# Patient Record
Sex: Male | Born: 1970 | Race: White | Hispanic: No | Smoking: Never smoker
Health system: Southern US, Community
[De-identification: ages and names within clinical notes are randomized; demographics above are authoritative.]

## PROBLEM LIST (undated history)

## (undated) VITALS — BP 157/113 | HR 99 | Temp 97.4°F | Resp 16 | Ht 67.0 in | Wt 185.0 lb

## (undated) DIAGNOSIS — R569 Unspecified convulsions: Secondary | ICD-10-CM

## (undated) DIAGNOSIS — F1020 Alcohol dependence, uncomplicated: Secondary | ICD-10-CM

## (undated) DIAGNOSIS — F32A Depression, unspecified: Secondary | ICD-10-CM

## (undated) DIAGNOSIS — I729 Aneurysm of unspecified site: Secondary | ICD-10-CM

## (undated) DIAGNOSIS — F329 Major depressive disorder, single episode, unspecified: Secondary | ICD-10-CM

## (undated) HISTORY — PX: OTHER SURGICAL HISTORY: SHX169

## (undated) HISTORY — PX: SKIN GRAFT: SHX250

---

## 2003-07-13 ENCOUNTER — Inpatient Hospital Stay (HOSPITAL_COMMUNITY): Admission: EM | Admit: 2003-07-13 | Discharge: 2003-07-16 | Payer: Self-pay | Admitting: Psychiatry

## 2003-12-20 ENCOUNTER — Other Ambulatory Visit: Payer: Self-pay

## 2003-12-21 ENCOUNTER — Other Ambulatory Visit: Payer: Self-pay

## 2004-02-21 ENCOUNTER — Other Ambulatory Visit: Payer: Self-pay

## 2004-03-08 ENCOUNTER — Other Ambulatory Visit: Payer: Self-pay

## 2004-03-22 ENCOUNTER — Other Ambulatory Visit: Payer: Self-pay

## 2004-03-26 ENCOUNTER — Other Ambulatory Visit: Payer: Self-pay

## 2004-04-20 ENCOUNTER — Other Ambulatory Visit: Payer: Self-pay

## 2004-07-22 ENCOUNTER — Inpatient Hospital Stay: Payer: Self-pay | Admitting: Internal Medicine

## 2004-07-22 ENCOUNTER — Other Ambulatory Visit: Payer: Self-pay

## 2004-07-23 ENCOUNTER — Other Ambulatory Visit: Payer: Self-pay

## 2004-07-26 ENCOUNTER — Inpatient Hospital Stay: Payer: Self-pay | Admitting: Anesthesiology

## 2004-07-26 ENCOUNTER — Other Ambulatory Visit: Payer: Self-pay

## 2004-08-20 ENCOUNTER — Emergency Department: Payer: Self-pay | Admitting: Emergency Medicine

## 2004-09-17 ENCOUNTER — Emergency Department: Payer: Self-pay | Admitting: Emergency Medicine

## 2004-09-18 ENCOUNTER — Emergency Department: Payer: Self-pay | Admitting: Emergency Medicine

## 2004-10-22 ENCOUNTER — Inpatient Hospital Stay: Payer: Self-pay | Admitting: Internal Medicine

## 2004-10-22 ENCOUNTER — Other Ambulatory Visit: Payer: Self-pay

## 2004-10-26 ENCOUNTER — Emergency Department: Payer: Self-pay | Admitting: Unknown Physician Specialty

## 2004-10-27 ENCOUNTER — Emergency Department: Payer: Self-pay | Admitting: Internal Medicine

## 2004-10-30 ENCOUNTER — Ambulatory Visit: Payer: Self-pay | Admitting: Internal Medicine

## 2005-05-10 ENCOUNTER — Emergency Department: Payer: Self-pay | Admitting: General Practice

## 2005-06-23 ENCOUNTER — Emergency Department: Payer: Self-pay | Admitting: Emergency Medicine

## 2005-06-23 ENCOUNTER — Other Ambulatory Visit: Payer: Self-pay

## 2005-06-24 ENCOUNTER — Other Ambulatory Visit: Payer: Self-pay

## 2005-06-25 ENCOUNTER — Inpatient Hospital Stay: Payer: Self-pay | Admitting: Internal Medicine

## 2006-03-15 ENCOUNTER — Other Ambulatory Visit: Payer: Self-pay

## 2006-03-15 ENCOUNTER — Emergency Department: Payer: Self-pay | Admitting: Emergency Medicine

## 2006-03-18 ENCOUNTER — Other Ambulatory Visit: Payer: Self-pay

## 2006-03-18 ENCOUNTER — Inpatient Hospital Stay: Payer: Self-pay | Admitting: Internal Medicine

## 2006-03-24 ENCOUNTER — Other Ambulatory Visit: Payer: Self-pay

## 2012-07-12 ENCOUNTER — Encounter (HOSPITAL_COMMUNITY): Payer: Self-pay | Admitting: Emergency Medicine

## 2012-07-12 ENCOUNTER — Emergency Department (HOSPITAL_COMMUNITY)
Admission: EM | Admit: 2012-07-12 | Discharge: 2012-07-13 | Disposition: A | Discharge status: Other Acute Inpatient Hospital | Payer: Self-pay | Attending: Emergency Medicine | Admitting: Emergency Medicine

## 2012-07-12 DIAGNOSIS — R110 Nausea: Secondary | ICD-10-CM | POA: Insufficient documentation

## 2012-07-12 DIAGNOSIS — R259 Unspecified abnormal involuntary movements: Secondary | ICD-10-CM | POA: Insufficient documentation

## 2012-07-12 DIAGNOSIS — Z8679 Personal history of other diseases of the circulatory system: Secondary | ICD-10-CM | POA: Insufficient documentation

## 2012-07-12 DIAGNOSIS — R6889 Other general symptoms and signs: Secondary | ICD-10-CM | POA: Insufficient documentation

## 2012-07-12 DIAGNOSIS — F411 Generalized anxiety disorder: Secondary | ICD-10-CM | POA: Insufficient documentation

## 2012-07-12 DIAGNOSIS — F1010 Alcohol abuse, uncomplicated: Secondary | ICD-10-CM | POA: Insufficient documentation

## 2012-07-12 DIAGNOSIS — G40909 Epilepsy, unspecified, not intractable, without status epilepticus: Secondary | ICD-10-CM | POA: Insufficient documentation

## 2012-07-12 HISTORY — DX: Unspecified convulsions: R56.9

## 2012-07-12 HISTORY — DX: Aneurysm of unspecified site: I72.9

## 2012-07-12 LAB — CBC WITH DIFFERENTIAL/PLATELET
HCT: 39.4 % (ref 39.0–52.0)
Hemoglobin: 13.3 g/dL (ref 13.0–17.0)
Lymphocytes Relative: 30 % (ref 12–46)
Monocytes Absolute: 0.6 10*3/uL (ref 0.1–1.0)
Monocytes Relative: 12 % (ref 3–12)
Neutro Abs: 2.9 10*3/uL (ref 1.7–7.7)
WBC: 5.2 10*3/uL (ref 4.0–10.5)

## 2012-07-12 LAB — COMPREHENSIVE METABOLIC PANEL
ALT: 89 U/L — ABNORMAL HIGH (ref 0–53)
BUN: 8 mg/dL (ref 6–23)
Calcium: 8.7 mg/dL (ref 8.4–10.5)
GFR calc Af Amer: >90 mL/min (ref >90)
Glucose, Bld: 91 mg/dL (ref 70–99)
Sodium: 134 mEq/L — ABNORMAL LOW (ref 135–145)
Total Protein: 8.1 g/dL (ref 6.0–8.3)

## 2012-07-12 LAB — RAPID URINE DRUG SCREEN, HOSP PERFORMED
Amphetamines: NOT DETECTED
Benzodiazepines: NOT DETECTED
Tetrahydrocannabinol: NOT DETECTED

## 2012-07-12 MED ORDER — ALUM & MAG HYDROXIDE-SIMETH 200-200-20 MG/5ML PO SUSP: 30.0000 mL | ORAL | Status: DC | PRN

## 2012-07-12 MED ORDER — LORAZEPAM 1 MG PO TABS
0.0000 mg | ORAL_TABLET | Freq: Two times a day (BID) | ORAL | Status: DC
  Administered 2012-07-13: 2 mg via ORAL

## 2012-07-12 MED ORDER — LORAZEPAM 1 MG PO TABS: 1.0000 mg | ORAL_TABLET | Freq: Four times a day (QID) | ORAL | Status: DC | PRN

## 2012-07-12 MED ORDER — VITAMIN B-1 100 MG PO TABS
100.0000 mg | ORAL_TABLET | Freq: Every day | ORAL | Status: DC
  Filled 2012-07-12 (×2): qty 1

## 2012-07-12 MED ORDER — IBUPROFEN 200 MG PO TABS: 600.0000 mg | ORAL_TABLET | Freq: Three times a day (TID) | ORAL | Status: DC | PRN

## 2012-07-12 MED ORDER — ADULT MULTIVITAMIN W/MINERALS CH
1.0000 | ORAL_TABLET | Freq: Every day | ORAL | Status: DC
  Administered 2012-07-12: 1 via ORAL
  Filled 2012-07-12 (×2): qty 1

## 2012-07-12 MED ORDER — THIAMINE HCL 100 MG/ML IJ SOLN: 100.0000 mg | Freq: Every day | INTRAMUSCULAR | Status: DC

## 2012-07-12 MED ORDER — LORAZEPAM 1 MG PO TABS
0.0000 mg | ORAL_TABLET | Freq: Four times a day (QID) | ORAL | Status: DC
  Administered 2012-07-12 – 2012-07-13 (×2): 2 mg via ORAL
  Filled 2012-07-12 (×3): qty 2

## 2012-07-12 MED ORDER — FOLIC ACID 1 MG PO TABS
1.0000 mg | ORAL_TABLET | Freq: Every day | ORAL | Status: DC
  Administered 2012-07-12: 1 mg via ORAL
  Filled 2012-07-12 (×2): qty 1

## 2012-07-12 MED ORDER — SODIUM CHLORIDE 0.9 % IV BOLUS (SEPSIS)
1000.0000 mL | Freq: Once | INTRAVENOUS | Status: AC
  Administered 2012-07-13: 1000 mL via INTRAVENOUS

## 2012-07-12 MED ORDER — LORAZEPAM 2 MG/ML IJ SOLN: 1.0000 mg | Freq: Four times a day (QID) | INTRAMUSCULAR | Status: DC | PRN

## 2012-07-12 MED ORDER — NICOTINE 21 MG/24HR TD PT24: 21.0000 mg | MEDICATED_PATCH | Freq: Every day | TRANSDERMAL | Status: DC

## 2012-07-12 MED ORDER — ONDANSETRON HCL 4 MG PO TABS: 4.0000 mg | ORAL_TABLET | Freq: Three times a day (TID) | ORAL | Status: DC | PRN

## 2012-07-12 NOTE — ED Notes (Signed)
Pt was wanded by security.  
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
## 2012-07-12 NOTE — ED Notes (Signed)
Maren Reamer:  [phone]

## 2012-07-12 NOTE — ED Notes (Signed)
Pt presenting to ed with c/o requesting detox. Pt states he had drank 12-18 beers everyday x last 5 days. Pt denies SI/HI pt is alert and oriented at this time. Pt with girlfriend at bedside

## 2012-07-12 NOTE — ED Provider Notes (Signed)
History     CSN: [id number]  Arrival date & time 07/12/12  Eric Manning   First MD Initiated Contact with Patient 07/12/12 2209      Chief Complaint  Patient presents with  . detox    (Consider location/radiation/quality/duration/timing/severity/associated sxs/prior treatment) HPI History provided by pt.   Pt presents w/ request for alcohol detox.  On average, drinks 18 beers a day w/ an occasional liquor drink.  Usually has 2 beers in the middle of the night when he gets up to use restroom.  Most recent drink early this morning.  He is currently experiencing tremors, anxiety and mild nausea.  Reports prior h/o seizures in setting of alcohol withdrawal, as well as DTs, though no documentation to confirm. Denies using any other illicit drugs.  No other psych history and denies SI/HI.   Also c/o sensation of throat and tongue edema x 2 days.  Has not started any new medications and has no known allergies.  Has sore throat as well.    Past Medical History  Diagnosis Date  . Aneurysm   . Seizures     Past Surgical History  Procedure Date  . Skin graft   . Rotator cuff surgery     No family history on file.  History  Substance Use Topics  . Smoking status: Never Smoker   . Smokeless tobacco: Not on file  . Alcohol Use: 0.0 oz/week    12-18 Cans of beer per week     Comment: daily      Review of Systems  All other systems reviewed and are negative.    Allergies  Review of patient's allergies indicates no known allergies.  Home Medications   Current Outpatient Rx  Name  Route  Sig  Dispense  Refill  . GOODY HEADACHE PO   Oral   Take 1 packet by mouth 3 (three) times daily as needed. For pain.         Marland Kitchen GUMMI BEAR MULTIVITAMIN/MIN PO CHEW   Oral   Chew 2 tablets by mouth daily.           BP 155/83  Pulse 118  Temp 98 F (36.7 C) (Oral)  Resp 18  SpO2 97%  Physical Exam  Nursing note and vitals reviewed. Constitutional: He is oriented to person, place, and  time. He appears well-developed and well-nourished. No distress.  HENT:  Head: Normocephalic and atraumatic.  Mouth/Throat: Oropharynx is clear and moist and mucous membranes are normal. No posterior oropharyngeal edema.       No obvious lip or tongue edema.  Right tonsillar exudate.  Erythema tonsils, posterior pharynx and soft palate.   Eyes:       Normal appearance  Neck: Normal range of motion.  Cardiovascular: Regular rhythm.        tachycardia  Pulmonary/Chest: Effort normal and breath sounds normal. No stridor. No respiratory distress.  Abdominal: Soft. Bowel sounds are normal. He exhibits no distension. There is no tenderness.  Musculoskeletal: Normal range of motion.  Neurological: He is alert and oriented to person, place, and time.       Pt is very tremulous  Skin: Skin is warm and dry. No rash noted.  Psychiatric: He has a normal mood and affect. His behavior is normal.    ED Course  Procedures (including critical care time)  Labs Reviewed  COMPREHENSIVE METABOLIC PANEL - Abnormal; Notable for the following:    Sodium 134 (*)     AST 121 (*)  ALT 89 (*)     All other components within normal limits  CBC WITH DIFFERENTIAL - Abnormal; Notable for the following:    Platelets 138 (*)     All other components within normal limits  ETHANOL - Abnormal; Notable for the following:    Alcohol, Ethyl (B) 275 (*)     All other components within normal limits  URINE RAPID DRUG SCREEN (HOSP PERFORMED)   No results found.   No diagnosis found.    MDM  41yo M w/ reported h/o seizures in setting of alcohol withdrawal, as well as DTs, presents w/ request for alcohol detox.  Also c/o tongue/throat edema and odynophagia x 2d.  On exam, pt tachycardic, tremulous and anxious appearing, no obvious tongue/lip edema or stridor, but erythema and exudate tonsils/posterior pharynx. CIWA protocol initiated.  Will check on patient frequently to determine whether or not admission for  detox is indicated.  Labs unremarkable w/ exception of strep screen which is pending. 11:24 PM   After 2mg  po ativan, Tremors have improved but patient tachy at 132bpm.  Will move to room on acute side, place on monitor and give him an IV NS bolus.  12:16 AM   Patient has responded to fluids and ativan.  HR improved and he is less tremulous.  Dr. Patria Mane has moved him back to TCU and spoken w/ ACT.      Otilio Miu, [place] 07/13/12 [phone]

## 2012-07-12 NOTE — ED Notes (Signed)
Pt changed into blue scrubs/red socks, pt/family explained psyc visitor/phone call policy, belongings taken home w/ wife, pt to be wanded by security.

## 2012-07-13 ENCOUNTER — Emergency Department (HOSPITAL_COMMUNITY): Payer: Self-pay

## 2012-07-13 ENCOUNTER — Inpatient Hospital Stay (HOSPITAL_COMMUNITY)
Admission: EM | Admit: 2012-07-13 | Discharge: 2012-07-16 | DRG: 897 | Disposition: A | Discharge status: Home / Self Care | Payer: Federal, State, Local not specified - Other | Source: Ambulatory Visit | Attending: Psychiatry | Admitting: Psychiatry

## 2012-07-13 ENCOUNTER — Encounter (HOSPITAL_COMMUNITY): Payer: Self-pay | Admitting: *Deleted

## 2012-07-13 DIAGNOSIS — F4310 Post-traumatic stress disorder, unspecified: Secondary | ICD-10-CM | POA: Diagnosis present

## 2012-07-13 DIAGNOSIS — F3289 Other specified depressive episodes: Secondary | ICD-10-CM | POA: Diagnosis present

## 2012-07-13 DIAGNOSIS — F329 Major depressive disorder, single episode, unspecified: Secondary | ICD-10-CM | POA: Diagnosis present

## 2012-07-13 DIAGNOSIS — F1020 Alcohol dependence, uncomplicated: Principal | ICD-10-CM | POA: Diagnosis present

## 2012-07-13 DIAGNOSIS — R030 Elevated blood-pressure reading, without diagnosis of hypertension: Secondary | ICD-10-CM | POA: Diagnosis present

## 2012-07-13 MED ORDER — ADULT MULTIVITAMIN W/MINERALS CH
1.0000 | ORAL_TABLET | Freq: Every day | ORAL | Status: DC
  Administered 2012-07-14 – 2012-07-16 (×3): 1 via ORAL
  Filled 2012-07-13 (×6): qty 1

## 2012-07-13 MED ORDER — FOLIC ACID 1 MG PO TABS
1.0000 mg | ORAL_TABLET | Freq: Every day | ORAL | Status: DC
  Administered 2012-07-14 – 2012-07-16 (×3): 1 mg via ORAL
  Filled 2012-07-13 (×6): qty 1

## 2012-07-13 MED ORDER — THIAMINE HCL 100 MG/ML IJ SOLN: 100.0000 mg | Freq: Every day | INTRAMUSCULAR | Status: DC

## 2012-07-13 MED ORDER — LORAZEPAM 2 MG/ML IJ SOLN: 2.0000 mg | Freq: Once | INTRAMUSCULAR | Status: DC

## 2012-07-13 MED ORDER — VITAMIN B-1 100 MG PO TABS
100.0000 mg | ORAL_TABLET | Freq: Every day | ORAL | Status: DC
  Administered 2012-07-14 – 2012-07-16 (×3): 100 mg via ORAL
  Filled 2012-07-13 (×6): qty 1

## 2012-07-13 MED ORDER — THIAMINE HCL 100 MG/ML IJ SOLN: 100.0000 mg | Freq: Once | INTRAMUSCULAR | Status: DC

## 2012-07-13 MED ORDER — CHLORDIAZEPOXIDE HCL 25 MG PO CAPS: 25.0000 mg | ORAL_CAPSULE | Freq: Every day | ORAL | Status: DC

## 2012-07-13 MED ORDER — SODIUM CHLORIDE 0.9 % IV BOLUS (SEPSIS)
1000.0000 mL | Freq: Once | INTRAVENOUS | Status: AC
  Administered 2012-07-13: 1000 mL via INTRAVENOUS

## 2012-07-13 MED ORDER — VITAMIN B-1 100 MG PO TABS
100.0000 mg | ORAL_TABLET | Freq: Every day | ORAL | Status: DC
  Filled 2012-07-13: qty 1

## 2012-07-13 MED ORDER — CHLORDIAZEPOXIDE HCL 25 MG PO CAPS
25.0000 mg | ORAL_CAPSULE | Freq: Four times a day (QID) | ORAL | Status: AC | PRN
  Administered 2012-07-14: 25 mg via ORAL
  Filled 2012-07-13 (×2): qty 1

## 2012-07-13 MED ORDER — LOPERAMIDE HCL 2 MG PO CAPS: 2.0000 mg | ORAL_CAPSULE | ORAL | Status: AC | PRN

## 2012-07-13 MED ORDER — LORAZEPAM 2 MG/ML IJ SOLN: 1.0000 mg | Freq: Four times a day (QID) | INTRAMUSCULAR | Status: DC | PRN

## 2012-07-13 MED ORDER — ONDANSETRON 4 MG PO TBDP: 4.0000 mg | ORAL_TABLET | Freq: Four times a day (QID) | ORAL | Status: AC | PRN

## 2012-07-13 MED ORDER — CHLORDIAZEPOXIDE HCL 25 MG PO CAPS
25.0000 mg | ORAL_CAPSULE | ORAL | Status: DC
  Administered 2012-07-16: 25 mg via ORAL
  Filled 2012-07-13: qty 1

## 2012-07-13 MED ORDER — CHLORDIAZEPOXIDE HCL 25 MG PO CAPS
25.0000 mg | ORAL_CAPSULE | Freq: Three times a day (TID) | ORAL | Status: AC
  Administered 2012-07-15 (×3): 25 mg via ORAL
  Filled 2012-07-13 (×3): qty 1

## 2012-07-13 MED ORDER — HYDROXYZINE HCL 25 MG PO TABS
25.0000 mg | ORAL_TABLET | Freq: Four times a day (QID) | ORAL | Status: AC | PRN
  Administered 2012-07-15: 25 mg via ORAL

## 2012-07-13 MED ORDER — LORAZEPAM 1 MG PO TABS
1.0000 mg | ORAL_TABLET | Freq: Four times a day (QID) | ORAL | Status: DC | PRN
  Administered 2012-07-13: 1 mg via ORAL
  Filled 2012-07-13: qty 1

## 2012-07-13 MED ORDER — CHLORDIAZEPOXIDE HCL 25 MG PO CAPS
25.0000 mg | ORAL_CAPSULE | Freq: Four times a day (QID) | ORAL | Status: AC
  Administered 2012-07-13 – 2012-07-14 (×7): 25 mg via ORAL
  Filled 2012-07-13 (×6): qty 1

## 2012-07-13 MED ORDER — ADULT MULTIVITAMIN W/MINERALS CH
1.0000 | ORAL_TABLET | Freq: Every day | ORAL | Status: DC
  Filled 2012-07-13 (×2): qty 1
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
## 2012-07-13 NOTE — BHH Counselor (Signed)
Patient accepted to [facility] by Donell Sievert, PA to Dr. Geoffery Lyons. The patient's room assignment is 301-2. Notified EDP (Dr. Baldemar Friday) and he agrees to discharge patient accordingly. Also notified patients nurse Marchelle Folks) of patient's disposition. She will call report to [phone] and assist with patients transfer to [facility].

## 2012-07-13 NOTE — ED Provider Notes (Signed)
Medical screening examination/treatment/procedure(s) were conducted as a shared visit with non-physician practitioner(s) and myself.  I personally evaluated the patient during the encounter  After IV fluids and several doses of ativan his HR improved. He was likely in early withdrawal, however, at this time I believe the pt is stable for transfer to psych ER with ongoing monitoring for ETOH withdrawal. Ativan protocol. Spoke with [facility] who will evaluate for placement  Lyanne Co, MD 07/13/12 2205

## 2012-07-13 NOTE — Tx Team (Signed)
Initial Interdisciplinary Treatment Plan  PATIENT STRENGTHS: (choose at least two) Ability for insight Average or above average intelligence Communication skills General fund of knowledge Motivation for treatment/growth Physical Health Special hobby/interest Supportive family/friends Work skills  PATIENT STRESSORS: Substance abuse   PROBLEM LIST: Problem List/Patient Goals Date to be addressed Date deferred Reason deferred Estimated date of resolution  Substance abuse 07/13/12                                                      DISCHARGE CRITERIA:  Ability to meet basic life and health needs Improved stabilization in mood, thinking, and/or behavior Motivation to continue treatment in a less acute level of care Need for constant or close observation no longer present Withdrawal symptoms are absent or subacute and managed without 24-hour nursing intervention  PRELIMINARY DISCHARGE PLAN: Attend aftercare/continuing care group Attend 12-step recovery group Return to previous living arrangement Return to previous work or school arrangements  PATIENT/FAMIILY INVOLVEMENT: This treatment plan has been presented to and reviewed with the patient, Eric Manning, and/or family member,   The patient and family have been given the opportunity to ask questions and make suggestions.  [name] 07/13/2012, 11:48 AM
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
## 2012-07-13 NOTE — ED Notes (Signed)
[id number]> Expected date:<BR> Expected time:<BR> Means of arrival:<BR> Comments:<BR> Hold for TCU pt

## 2012-07-13 NOTE — ED Notes (Signed)
Patient transferred to [facility]. Escorted by Social worker. Patient transferred without any belongings.

## 2012-07-14 DIAGNOSIS — F1010 Alcohol abuse, uncomplicated: Secondary | ICD-10-CM

## 2012-07-14 MED ORDER — ONDANSETRON 4 MG PO TBDP: 4.0000 mg | ORAL_TABLET | Freq: Four times a day (QID) | ORAL | Status: DC | PRN

## 2012-07-14 MED ORDER — NAPROXEN 500 MG PO TABS
500.0000 mg | ORAL_TABLET | Freq: Two times a day (BID) | ORAL | Status: DC | PRN
  Administered 2012-07-15 (×2): 500 mg via ORAL
  Filled 2012-07-14 (×2): qty 1

## 2012-07-14 MED ORDER — HYDROXYZINE HCL 25 MG PO TABS: 25.0000 mg | ORAL_TABLET | Freq: Four times a day (QID) | ORAL | Status: DC | PRN

## 2012-07-14 MED ORDER — CLONIDINE HCL 0.1 MG PO TABS
0.1000 mg | ORAL_TABLET | Freq: Four times a day (QID) | ORAL | Status: AC
  Administered 2012-07-14 – 2012-07-15 (×8): 0.1 mg via ORAL
  Filled 2012-07-14 (×7): qty 1

## 2012-07-14 MED ORDER — METHOCARBAMOL 500 MG PO TABS
500.0000 mg | ORAL_TABLET | Freq: Three times a day (TID) | ORAL | Status: DC | PRN
  Administered 2012-07-15 (×2): 500 mg via ORAL
  Filled 2012-07-14 (×2): qty 1

## 2012-07-14 MED ORDER — DICYCLOMINE HCL 20 MG PO TABS: 20.0000 mg | ORAL_TABLET | Freq: Four times a day (QID) | ORAL | Status: DC | PRN

## 2012-07-14 MED ORDER — LOPERAMIDE HCL 2 MG PO CAPS: 2.0000 mg | ORAL_CAPSULE | ORAL | Status: DC | PRN

## 2012-07-14 MED ORDER — CLONIDINE HCL 0.1 MG PO TABS
0.1000 mg | ORAL_TABLET | ORAL | Status: DC
  Administered 2012-07-16: 0.1 mg via ORAL
  Filled 2012-07-14 (×4): qty 1

## 2012-07-14 MED ORDER — CLONIDINE HCL 0.1 MG PO TABS
0.1000 mg | ORAL_TABLET | Freq: Every day | ORAL | Status: DC
  Filled 2012-07-14: qty 1

## 2012-07-14 NOTE — Progress Notes (Signed)
[facility] MD Progress Note  07/14/2012 12:08 PM Eric Manning  MRN:  [id number]  Diagnosis:   Axis I: Alcohol Abuse, Depressive Disorder NOS and Post Traumatic Stress Disorder Axis II: Deferred Axis III:  Past Medical History  Diagnosis Date  . Aneurysm   . Seizures     last seizure 2006 alcohol withdrawals   Axis IV: economic problems, other psychosocial or environmental problems and problems related to social environment Axis V: 41-50 serious symptoms  ADL's:  Intact  Sleep: Fair  Appetite:  Fair  Suicidal Ideation:  Denies Homicidal Ideation:  Denies  Mental Status Examination/Evaluation: Objective:  Appearance: Casual  Eye Contact::  Fair  Speech:  Normal Rate  Volume:  Normal  Mood:  Depressed  Affect:  Congruent  Thought Process:  Intact  Orientation:  Full  Thought Content:  WDL  Suicidal Thoughts:  No  Homicidal Thoughts:  No  Memory:  Immediate;   Fair Recent;   Fair Remote;   Fair  Judgement:  Fair  Insight:  Fair  Psychomotor Activity:  Normal  Concentration:  Fair  Recall:  Fair  Akathisia:  No  Handed:  Right  AIMS (if indicated):     Assets:  Communication Skills Desire for Improvement Intimacy Physical Health Social Support  Sleep:  Number of Hours: 4.25    Vital Signs:Blood pressure 143/105, pulse 98, temperature 98 F (36.7 C), temperature source Oral, resp. rate 20, height 5\' 7"  (1.702 m), weight 83.915 kg (185 lb). Current Medications: Current Facility-Administered Medications  Medication Dose Route Frequency Provider Last Rate Last Dose  . chlordiazePOXIDE (LIBRIUM) capsule 25 mg  25 mg Oral Q6H PRN Rachael Fee, MD   25 mg at 07/14/12 0402  . chlordiazePOXIDE (LIBRIUM) capsule 25 mg  25 mg Oral QID Rachael Fee, MD   25 mg at 07/14/12 7829   Followed by  . chlordiazePOXIDE (LIBRIUM) capsule 25 mg  25 mg Oral TID Rachael Fee, MD       Followed by  . chlordiazePOXIDE (LIBRIUM) capsule 25 mg  25 mg Oral BH-qamhs Rachael Fee, MD         Followed by  . chlordiazePOXIDE (LIBRIUM) capsule 25 mg  25 mg Oral Daily Rachael Fee, MD      . cloNIDine (CATAPRES) tablet 0.1 mg  0.1 mg Oral QID Nanine Means, NP   0.1 mg at 07/14/12 5621   Followed by  . cloNIDine (CATAPRES) tablet 0.1 mg  0.1 mg Oral BH-qamhs Nanine Means, NP       Followed by  . cloNIDine (CATAPRES) tablet 0.1 mg  0.1 mg Oral QAC breakfast Nanine Means, NP      . dicyclomine (BENTYL) tablet 20 mg  20 mg Oral Q6H PRN Nanine Means, NP      . folic acid (FOLVITE) tablet 1 mg  1 mg Oral Daily Nanine Means, NP   1 mg at 07/14/12 3086  . hydrOXYzine (ATARAX/VISTARIL) tablet 25 mg  25 mg Oral Q6H PRN Rachael Fee, MD      . loperamide (IMODIUM) capsule 2-4 mg  2-4 mg Oral PRN Rachael Fee, MD      . methocarbamol (ROBAXIN) tablet 500 mg  500 mg Oral Q8H PRN Nanine Means, NP      . multivitamin with minerals tablet 1 tablet  1 tablet Oral Daily Rachael Fee, MD   1 tablet at 07/14/12 5784  . naproxen (NAPROSYN) tablet 500 mg  500 mg Oral BID  PRN Nanine Means, NP      . ondansetron (ZOFRAN-ODT) disintegrating tablet 4 mg  4 mg Oral Q6H PRN Rachael Fee, MD      . thiamine (B-1) injection 100 mg  100 mg Intramuscular Once Rachael Fee, MD      . thiamine (VITAMIN B-1) tablet 100 mg  100 mg Oral Daily Nanine Means, NP   100 mg at 07/14/12 0821  . [DISCONTINUED] hydrOXYzine (ATARAX/VISTARIL) tablet 25 mg  25 mg Oral Q6H PRN Nanine Means, NP      . [DISCONTINUED] loperamide (IMODIUM) capsule 2-4 mg  2-4 mg Oral PRN Nanine Means, NP      . [DISCONTINUED] LORazepam (ATIVAN) injection 1 mg  1 mg Intravenous Q6H PRN Nanine Means, NP      . [DISCONTINUED] LORazepam (ATIVAN) tablet 1 mg  1 mg Oral Q6H PRN Nanine Means, NP   1 mg at 07/13/12 1218  . [DISCONTINUED] multivitamin with minerals tablet 1 tablet  1 tablet Oral Daily Nanine Means, NP      . [DISCONTINUED] ondansetron (ZOFRAN-ODT) disintegrating tablet 4 mg  4 mg Oral Q6H PRN Nanine Means, NP      . [DISCONTINUED]  thiamine (B-1) injection 100 mg  100 mg Intravenous Daily Nanine Means, NP      . [DISCONTINUED] thiamine (VITAMIN B-1) tablet 100 mg  100 mg Oral Daily Rachael Fee, MD        Lab Results:  Results for orders placed during the hospital encounter of 07/12/12 (from the past 48 hour(s))  URINE RAPID DRUG SCREEN (HOSP PERFORMED)     Status: Normal   Collection Time   07/12/12  7:25 PM      Component Value Range Comment   Opiates NONE DETECTED  NONE DETECTED    Cocaine NONE DETECTED  NONE DETECTED    Benzodiazepines NONE DETECTED  NONE DETECTED    Amphetamines NONE DETECTED  NONE DETECTED    Tetrahydrocannabinol NONE DETECTED  NONE DETECTED    Barbiturates NONE DETECTED  NONE DETECTED   COMPREHENSIVE METABOLIC PANEL     Status: Abnormal   Collection Time   07/12/12  7:40 PM      Component Value Range Comment   Sodium 134 (*) 135 - 145 mEq/L    Potassium 4.2  3.5 - 5.1 mEq/L    Chloride 97  96 - 112 mEq/L    CO2 22  19 - 32 mEq/L    Glucose, Bld 91  70 - 99 mg/dL    BUN 8  6 - 23 mg/dL    Creatinine, Ser 1.61  0.50 - 1.35 mg/dL    Calcium 8.7  8.4 - 09.6 mg/dL    Total Protein 8.1  6.0 - 8.3 g/dL    Albumin 4.3  3.5 - 5.2 g/dL    AST 045 (*) 0 - 37 U/L    ALT 89 (*) 0 - 53 U/L    Alkaline Phosphatase 68  39 - 117 U/L    Total Bilirubin 0.6  0.3 - 1.2 mg/dL    GFR calc non Af Amer >90  >90 mL/min    GFR calc Af Amer >90  >90 mL/min   CBC WITH DIFFERENTIAL     Status: Abnormal   Collection Time   07/12/12  7:40 PM      Component Value Range Comment   WBC 5.2  4.0 - 10.5 K/uL    RBC 4.52  4.22 - 5.81 MIL/uL  Hemoglobin 13.3  13.0 - 17.0 g/dL    HCT 16.1  09.6 - 04.5 %    MCV 87.2  78.0 - 100.0 fL    MCH 29.4  26.0 - 34.0 pg    MCHC 33.8  30.0 - 36.0 g/dL    RDW 40.9  81.1 - 91.4 %    Platelets 138 (*) 150 - 400 K/uL    Neutrophils Relative 57  43 - 77 %    Neutro Abs 2.9  1.7 - 7.7 K/uL    Lymphocytes Relative 30  12 - 46 %    Lymphs Abs 1.5  0.7 - 4.0 K/uL    Monocytes  Relative 12  3 - 12 %    Monocytes Absolute 0.6  0.1 - 1.0 K/uL    Eosinophils Relative 0  0 - 5 %    Eosinophils Absolute 0.0  0.0 - 0.7 K/uL    Basophils Relative 1  0 - 1 %    Basophils Absolute 0.1  0.0 - 0.1 K/uL   ETHANOL     Status: Abnormal   Collection Time   07/12/12  7:40 PM      Component Value Range Comment   Alcohol, Ethyl (B) 275 (*) 0 - 11 mg/dL   RAPID STREP SCREEN     Status: Normal   Collection Time   07/12/12 11:17 PM      Component Value Range Comment   Streptococcus, Group A Screen (Direct) NEGATIVE  NEGATIVE     Physical Findings: AIMS: Facial and Oral Movements Muscles of Facial Expression: None, normal Lips and Perioral Area: None, normal Jaw: None, normal Tongue: None, normal,Extremity Movements Upper (arms, wrists, hands, fingers): None, normal Lower (legs, knees, ankles, toes): None, normal, Trunk Movements Neck, shoulders, hips: None, normal, Overall Severity Severity of abnormal movements (highest score from questions above): None, normal Incapacitation due to abnormal movements: None, normal Patient's awareness of abnormal movements (rate only patient's report): No Awareness, Dental Status Current problems with teeth and/or dentures?: No Does patient usually wear dentures?: No  CIWA:  CIWA-Ar Total: 7  COWS:  COWS Total Score: 9   Treatment Plan Summary: Daily contact with patient to assess and evaluate symptoms and progress in treatment Medication management  Plan:  Individual and group therapy to continue, medication management of detox and blood pressure, blood pressure was elevated and clonidine ordered to address the issue  Nanine Means, NP 07/14/2012, 12:08 PM

## 2012-07-14 NOTE — Progress Notes (Signed)
Patient ID: Eric Manning, male   DOB: 1971/06/23, 41 y.o.   MRN: [id number] D- Patient reports depression and anxiety have decreased. Reports his generalized pain is 6 with a pain goal of 0. Denies SI/HI/AV hall .  A- Attending and participating in groups affect is blunted. B/P 144/92 clonidine 0.1 given as per ordered for detox symptoms.  R- Continue to monitor as per protocol, maintain 15 minute checks. Emotional support provided.

## 2012-07-14 NOTE — Progress Notes (Signed)
Pt laying in bed resting with eyes closed. Respirations even and unlabored. No distress noted.  

## 2012-07-14 NOTE — Tx Team (Signed)
Interdisciplinary Treatment Plan Update (Adult)  Date:  07/14/2012  Time Reviewed:  9:55 AM   Progress in Treatment: Attending groups: Yes Participating in groups:  Yes Taking medication as prescribed: Yes Tolerating medication:  Yes Family/Significant othe contact made:  CSW assessing for appropriate contact Patient understands diagnosis:  Yes Discussing patient identified problems/goals with staff:  Yes Medical problems stabilized or resolved:  Yes Denies suicidal/homicidal ideation: Yes Issues/concerns per patient self-inventory:  None identified Other: N/A  New problem(s) identified: None Identified  Reason for Continuation of Hospitalization: Anxiety Depression Medication stabilization Withdrawal symptoms  Interventions implemented related to continuation of hospitalization: mood stabilization, medication monitoring and adjustment, group therapy and psycho education, safety checks q 15 mins  Additional comments: N/A  Estimated length of stay: 3-5 days  Discharge Plan: CSW is assessing for appropriate referrals.    New goal(s): N/A  Review of initial/current patient goals per problem list:    1.  Goal(s): Address substance use  Met:  No  Target date: by discharge  As evidenced by: completing detox protocol and refer to appropriate treatment  2.  Goal (s): Reduce depressive and anxiety symptoms  Met:  No  Target date: by discharge  As evidenced by: Reducing depression from a 10 to a 3 as reported by pt.    Attendees: Patient:     Family:     Physician: Irving Lugo, MD 07/14/2012 9:55 AM   Nursing: Donna Shimp, RN 07/14/2012 9:55 AM   Clinical Social Worker:  Brodan Grewell Horton, LCSWA 07/14/2012  9:55 AM   Other: Donna Perez, RN 07/14/2012  9:55 AM   Other:  Jamison Lord, NP 07/14/2012 9:58 AM   Other:  Patrice White, RN 07/14/2012 9:59 AM   Other:  Jake King, Psyc intern 07/14/2012 9:59 AM   Other:      Scribe for Treatment Team:   Hyder Deman Horton 07/14/2012  9:55 AM   

## 2012-07-14 NOTE — Social Work (Signed)
Aftercare Planning Group: 07/14/2012 9:45 AM  Pt attended discharge planning group and actively participated in group.  CSW provided pt with today's workbook.  Pt presents with calm mood and affect.  Pt rates anxiety at a 5 and denies having depression, SI/HI.  Pt reports feeling better today.  Pt will return home and is open to following up at [facility] for medication management and therapy.  No further needs voiced by pt at this time.     [facility] Group Note : Clinical Social Worker Group Therapy  07/14/2012  1:15 PM  Type of Therapy:  Group Therapy  Participation Level:  Appropriate  Participation Quality:  Appropriate   Affect:  Appropriate  Cognitive:  Alert  Insight:  Good  Engagement in Group:  Good  Engagement in Therapy:  Good  Modes of Intervention:  Activity, Clarification, Education, Socialization and Support  Summary of Progress/Problems: The topic of group discussion today was emotion regulation and mindfulness.  CSW discussed being aware of thoughts and how they affect your mood, physical sensations and behaviors.  CSW discussed how pt can use this to stay in the moment and suspending judgment on self.  Pt actively participated in discussion and expressed how they could use this in their life.     Jonothan Heberle Horton, LCSWA 07/14/2012 3:00 pm

## 2012-07-15 NOTE — Progress Notes (Signed)
[facility] Family/Significant Other Suicide Prevention Education  Suicide Prevention Education:  Education Completed; [place] - girlfriend [phone]),  (name of family member/significant other) has been identified by the patient as the family member/significant other with whom the patient will be residing, and identified as the person(s) who will aid the patient in the event of a mental health crisis (suicidal ideations/suicide attempt).  With written consent from the patient, the family member/significant other has been provided the following suicide prevention education, prior to the and/or following the discharge of the patient.  The suicide prevention education provided includes the following:  Suicide risk factors  Suicide prevention and interventions  National Suicide Hotline telephone number  [facility] assessment telephone number  [facility] Emergency Assistance 911  [facility] and/or Residential Mobile Crisis Unit telephone number  Request made of family/significant other to:  Remove weapons (e.g., guns, rifles, knives), all items previously/currently identified as safety concern.    Remove drugs/medications (over-the-counter, prescriptions, illicit drugs), all items previously/currently identified as a safety concern.  The family member/significant other verbalizes understanding of the suicide prevention education information provided.  The family member/significant other agrees to remove the items of safety concern listed above.  Eric Manning 07/15/2012, 12:31 PM

## 2012-07-15 NOTE — Progress Notes (Signed)
D:  Per pt self inventory pt slept well, appetite is good, energy level is normal, ability to pay attention is good...., Pt denies SI/HI/AVH, denies withdrawal s/s, c/o back pain 5/10, denies depression and hopelessness.  A:  Encouraged pt to attend groups, pain meds given prn  R:  Pt receptive to treatment, pt pleasant and cooperative, interactive in milieu and attends, pt plans to go to outpatient treatment after discharge

## 2012-07-15 NOTE — Social Work (Signed)
Aftercare Planning Group: 07/15/2012 9:45 AM  Pt attended discharge planning group and actively participated in group.  CSW provided pt with today's workbook.  Pt presents with calm mood and affect.  Pt denies having anxiety and SI/HI.  Pt rates anxiety at a 3 today.  Pt reports feeling much better.  Pt is scheduled to follow up with [facility] for medication management and therapy.  No further needs voiced by pt at this time.  Safety planning and suicide prevention discussed.  Pt participated in discussion and acknowledged an understanding of the information provided.       [facility] Group Note : Clinical Social Worker Group Therapy  07/15/2012  1:15 PM  Type of Therapy:  Group Therapy  Participation Level:  Appropriate  Participation Quality:  Appropriate   Affect:  Appropriate  Cognitive:  Alert  Insight:  Good  Engagement in Group:  Good  Engagement in Therapy:  Good  Modes of Intervention:  Clarification, Education, Problem-solving, Socialization and Support  Summary of Progress/Problems: The topic for group was balance in life.  Pt participated in the discussion about when their life was in balance and out of balance and how this feels.  Pt discussed ways to get back in balance and short term goals they can work on to get where they want to be.  Pt discussed feeling like he has balance in his life at home is trying to figure out how to maintain this balance when he d/c.    Jerimie Mancuso Horton, LCSWA 07/15/2012 3:00 pm

## 2012-07-15 NOTE — Progress Notes (Signed)
D: Pt has been up and has been attending and participating in various milieu activities throughout the day today, pt does not display any signs or symptoms of withdrawal and has stated that he is feeling ok, pt has received all medications without incident and has spoken about being discharged tomorrow. A: Pt provided with support and encouragement.  R: Pt remains safe, will continue to monitor.

## 2012-07-15 NOTE — Progress Notes (Signed)
Patient attended group: Writer used therapy ball to ask questions about patients. Writer also discussed with group alternative positive ways to cope with negative behaviors. 

## 2012-07-15 NOTE — Progress Notes (Signed)
Patient ID: Eric Manning, male   DOB: 26-Nov-1970, 41 y.o.   MRN: [id number]  D:  Pt was brighter today than previous days. Informed the writer that the visitor was his girlfriend. Pt smiled as he spoke of his girlfriend. Informed the writer that his visit went well, "She's one of a kind". Smiling pt stated, "She's a [place]". Writer asked pt if that was a good thing. Stated, "oh yeah'. Pt stated he plans to return to their home.  A:  Support and encouragement was offered. 15 min checks continued for safety.  R: Pt remains safe.

## 2012-07-15 NOTE — Progress Notes (Signed)
[facility] MD Progress Note  07/15/2012 9:16 AM Eric Manning  MRN:  [id number]  Diagnosis:   Axis I: Alcohol Dependence, Depressive Disorder NOS, PTSD Axis II: Deferred Axis III:  Past Medical History  Diagnosis Date  . Aneurysm   . Seizures     last seizure 2006 alcohol withdrawals   Axis IV: None identified Axis V: 51-60 moderate symptoms  ADL's:  Intact  Sleep: Fair  Appetite:  Fair  Suicidal Ideation:  Plan:  Denies Intent:  Denies Means:  Denies Homicidal Ideation:  Plan:  Denies Intent:  Denies Means:  Denies  Has lacked a stable environment for a long time. This relationship has been the most stable situation he has been on. He is concerned about messing up. He is committed to his abstinence. He understands that he has a lot of things he needs to address from his past.  Depression: Not since he quit drinking 1 Anxiety:4 Cravings: 0 Mental Status Examination/Evaluation: Objective:  Appearance: Fairly Groomed  Patent attorney::  Fair  Speech:  Clear and Coherent  Volume:  Normal  Mood:  Better  Affect:  Appropriate  Thought Process:  Coherent, Goal Directed and Intact  Orientation:  Full  Thought Content:  WDL  Suicidal Thoughts:  No  Homicidal Thoughts:  No  Memory:  Immediate;   Fair Recent;   Fair Remote;   Fair  Judgement:  Intact  Insight:  Fair  Psychomotor Activity:  Normal  Concentration:  Fair  Recall:  Fair  Akathisia:  No  Handed:  Right  AIMS (if indicated):     Assets:  Communication Skills Desire for Improvement Housing Social Support  Sleep:  Number of Hours: 6    Vital Signs:Blood pressure 124/91, pulse 98, temperature 97.6 F (36.4 C), temperature source Oral, resp. rate 20, height 5\' 7"  (1.702 m), weight 83.915 kg (185 lb). Current Medications: Current Facility-Administered Medications  Medication Dose Route Frequency Provider Last Rate Last Dose  . chlordiazePOXIDE (LIBRIUM) capsule 25 mg  25 mg Oral Q6H PRN Rachael Fee, MD   25 mg  at 07/14/12 0402  . [COMPLETED] chlordiazePOXIDE (LIBRIUM) capsule 25 mg  25 mg Oral QID Rachael Fee, MD   25 mg at 07/14/12 2240   Followed by  . chlordiazePOXIDE (LIBRIUM) capsule 25 mg  25 mg Oral TID Rachael Fee, MD   25 mg at 07/15/12 0757   Followed by  . chlordiazePOXIDE (LIBRIUM) capsule 25 mg  25 mg Oral BH-qamhs Rachael Fee, MD       Followed by  . chlordiazePOXIDE (LIBRIUM) capsule 25 mg  25 mg Oral Daily Rachael Fee, MD      . cloNIDine (CATAPRES) tablet 0.1 mg  0.1 mg Oral QID Nanine Means, NP   0.1 mg at 07/15/12 0757   Followed by  . cloNIDine (CATAPRES) tablet 0.1 mg  0.1 mg Oral BH-qamhs Nanine Means, NP       Followed by  . cloNIDine (CATAPRES) tablet 0.1 mg  0.1 mg Oral QAC breakfast Nanine Means, NP      . dicyclomine (BENTYL) tablet 20 mg  20 mg Oral Q6H PRN Nanine Means, NP      . folic acid (FOLVITE) tablet 1 mg  1 mg Oral Daily Nanine Means, NP   1 mg at 07/15/12 0757  . hydrOXYzine (ATARAX/VISTARIL) tablet 25 mg  25 mg Oral Q6H PRN Rachael Fee, MD      . loperamide (IMODIUM) capsule 2-4 mg  2-4 mg Oral PRN Rachael Fee, MD      . methocarbamol (ROBAXIN) tablet 500 mg  500 mg Oral Q8H PRN Nanine Means, NP      . multivitamin with minerals tablet 1 tablet  1 tablet Oral Daily Rachael Fee, MD   1 tablet at 07/15/12 0757  . naproxen (NAPROSYN) tablet 500 mg  500 mg Oral BID PRN Nanine Means, NP      . ondansetron (ZOFRAN-ODT) disintegrating tablet 4 mg  4 mg Oral Q6H PRN Rachael Fee, MD      . thiamine (B-1) injection 100 mg  100 mg Intramuscular Once Rachael Fee, MD      . thiamine (VITAMIN B-1) tablet 100 mg  100 mg Oral Daily Nanine Means, NP   100 mg at 07/15/12 0757  . [DISCONTINUED] hydrOXYzine (ATARAX/VISTARIL) tablet 25 mg  25 mg Oral Q6H PRN Nanine Means, NP      . [DISCONTINUED] loperamide (IMODIUM) capsule 2-4 mg  2-4 mg Oral PRN Nanine Means, NP      . [DISCONTINUED] ondansetron (ZOFRAN-ODT) disintegrating tablet 4 mg  4 mg Oral Q6H PRN  Nanine Means, NP        Lab Results: No results found for this or any previous visit (from the past 48 hour(s)).  Physical Findings: AIMS: Facial and Oral Movements Muscles of Facial Expression: None, normal Lips and Perioral Area: None, normal Jaw: None, normal Tongue: None, normal,Extremity Movements Upper (arms, wrists, hands, fingers): None, normal Lower (legs, knees, ankles, toes): None, normal, Trunk Movements Neck, shoulders, hips: None, normal, Overall Severity Severity of abnormal movements (highest score from questions above): None, normal Incapacitation due to abnormal movements: None, normal Patient's awareness of abnormal movements (rate only patient's report): No Awareness, Dental Status Current problems with teeth and/or dentures?: No Does patient usually wear dentures?: No  CIWA:  CIWA-Ar Total: 0  COWS:  COWS Total Score: 1   Treatment Plan Summary: Daily contact with patient to assess and evaluate symptoms and progress in treatment Medication management  Plan: Supportive approach/coping skills/relapse prevention  Cathryn Gallery A 07/15/2012, 9:16 AM

## 2012-07-16 MED ORDER — GUMMI BEAR MULTIVITAMIN/MIN PO CHEW: 2.0000 | CHEWABLE_TABLET | Freq: Every day | ORAL | Status: DC

## 2012-07-16 NOTE — Discharge Summary (Signed)
Physician Discharge Summary Note  Patient:  Eric Manning is an 41 y.o., male MRN:  [id number] DOB:  [birth year] Patient phone:  [phone] (home)  Patient address:   [address] [place] [address],   Date of Admission:  07/13/2012 Date of Discharge: 07/16/2012  Reason for Admission: Alcohol Detox  Discharge Diagnoses: Active Problems:  Alcohol dependence  Depressive disorder  Post traumatic stress disorder   Axis Diagnosis:  Discharge Diagnoses:  AXIS I: Alcohol Dependence, Depressive Disorder NOS, PTSD  AXIS II: Deferred  AXIS III:  Past Medical History   Diagnosis  Date   .  Aneurysm    .  Seizures      last seizure 2006 alcohol withdrawals   AXIS IV: None currently  AXIS V: 61-70 mild symptoms Level of Care:  OP  Hospital Course:  Magic was admitted for alcohol relapse, detox, stabilization an crisis management.   He was treated with the standard Librium/Clonodin protocol.  Medical problems were identified and treated.  Home medication was restarted as appropriate.     Improvement was monitored by CIWA/COWS scores and patient's daily report of withdrawal symptom reduction. Emotional and mental status was monitored by daily self inventory reports completed by the patient and clinical staff.      The patient was evaluated by the treatment team for stability and plans for continued recovery upon discharge. He/She was offered further treatment options upon discharge including Residential, IOP, and Outpatient treatment.  The patient's motivation was an integral factor for scheduling further treatment.  Employment, transportation, bed availability, health status, family support, and any pending legal issues were also considered.    Upon completion of detox the patient was both mentally and medically stable for discharge.   Consults:  None  Significant Diagnostic Studies:  None  Discharge Vitals:   Blood pressure 157/113, pulse 99, temperature 97.4 F (36.3 C),  temperature source Oral, resp. rate 16, height 5\' 7"  (1.702 m), weight 83.915 kg (185 lb). Lab Results:   No results found for this or any previous visit (from the past 72 hour(s)).  Physical Findings: AIMS: Facial and Oral Movements Muscles of Facial Expression: None, normal Lips and Perioral Area: None, normal Jaw: None, normal Tongue: None, normal,Extremity Movements Upper (arms, wrists, hands, fingers): None, normal Lower (legs, knees, ankles, toes): None, normal, Trunk Movements Neck, shoulders, hips: None, normal, Overall Severity Severity of abnormal movements (highest score from questions above): None, normal Incapacitation due to abnormal movements: None, normal Patient's awareness of abnormal movements (rate only patient's report): No Awareness, Dental Status Current problems with teeth and/or dentures?: No Does patient usually wear dentures?: No  CIWA:  CIWA-Ar Total: 2  COWS:  COWS Total Score: 1   Mental Status Exam: See Mental Status Examination and Suicide Risk Assessment completed by Attending Physician prior to discharge.  Discharge destination:  Home  Is patient on multiple antipsychotic therapies at discharge:  No   Has Patient had three or more failed trials of antipsychotic monotherapy by history:  No  Recommended Plan for Multiple Antipsychotic Therapies:  Not applicable  Discharge Orders    Future Orders Please Complete By Expires   Diet - low sodium heart healthy      Increase activity slowly      Discharge instructions      Comments:   Be sure to keep ALL follow up appointments as scheduled. This is to ensure getting your refills on time to avoid any interruption in your medication.  If you find  that you can not keep your appointment, call the clinic and reschedule. Be sure to tell the nurse if you will need a refill before your appointment.  90 meetings in 90 days for support!   Comments:   Take all of your medications as prescribed.  Be sure to keep  ALL follow up appointments as scheduled. This is to ensure getting your refills on time to avoid any interruption in your medication.  If you find that you can not keep your appointment, call the clinic and reschedule. Be sure to tell the nurse if you will need a refill before your appointment.   STOP taking these medications         GOODY HEADACHE PO      TAKE these medications      Indication    Gummi Bear Multivitamin/Min Chew   Chew 2 tablets by mouth daily.    Indication: nutritional support       Follow-up Information    Follow up with [facility]. On 07/20/2012. (Walk in on this date at 8:00 am with Dr. August Saucer)    Contact information:   [address], [place] [address] [phone]    Follow-up recommendations: See above Comments:      Signed, Rona Ravens. Dae Antonucci PAC :07/16/2012, 11:13 AM

## 2012-07-16 NOTE — Progress Notes (Signed)
Patient did attend the evening karaoke group.  

## 2012-07-16 NOTE — Progress Notes (Signed)
Psychoeducational Group Note  Date:  07/16/2012 Time:  1315  Group Topic/Focus:  Relapse Prevention Planning:   The focus of this group is to define relapse and discuss the need for planning to combat relapse.  Participation Level:  Minimal  Participation Quality:  Appropriate and Attentive  Affect:  Appropriate  Cognitive:  Alert  Insight:  pt did not participate in group but appeared engaged  Engagement in Group:  None  Additional Comments:  Pt attended group, appeared engaged in group but did not participate in discussion.   Dalia Heading 07/16/2012, 4:29 PM

## 2012-07-16 NOTE — Progress Notes (Signed)
[facility] Case Management Discharge Plan:  Will you be returning to the same living situation after discharge: Yes,  returning home At discharge, do you have transportation home?:Yes,  access to transportation Do you have the ability to pay for your medications:Yes,  access to meds   Release of information consent forms completed and in the chart;  Patient's signature needed at discharge.  Patient to Follow up at:  Follow-up Information    Follow up with [facility]. On 07/20/2012. (Walk in on this date at 8:00 am with Dr. August Saucer)    Contact information:   [address], [place] [address] [phone]         Patient denies SI/HI:   Yes,  denies SI/HI    Safety Planning and Suicide Prevention discussed:  Yes,  discussed with pt  Barrier to discharge identified:No.  Summary and Recommendations: Pt attended discharge planning group and actively participated in group.  CSW provided pt with today's workbook.  Pt presents with bright mood and affect.  Pt denies depression and rates anxiety at a 5 today.  Pt denies SI/HI.  Pt reports feeling stable to d/c today.  No recommendations from CSW.  No further needs voiced by pt.  Pt stable to discharge.     Carmina Miller 07/16/2012, 9:17 AM

## 2012-07-16 NOTE — Tx Team (Addendum)
Interdisciplinary Treatment Plan Update (Adult)  Date:  07/16/2012  Time Reviewed:  9:47 AM   Progress in Treatment: Attending groups: Yes Participating in groups:  Yes Taking medication as prescribed: Yes Tolerating medication:  Yes Family/Significant othe contact made:  Yes Patient understands diagnosis:  Yes Discussing patient identified problems/goals with staff:  Yes Medical problems stabilized or resolved:  Yes Denies suicidal/homicidal ideation: Yes Issues/concerns per patient self-inventory:  None identified Other: N/A  New problem(s) identified: None Identified  Reason for Continuation of Hospitalization: Stable to d/c  Interventions implemented related to continuation of hospitalization: Stable to d/c  Additional comments: N/A  Estimated length of stay: D/C today  Discharge Plan: Pt will follow up with [facility] for medication management and therapy.     New goal(s): N/A  Review of initial/current patient goals per problem list:    1.  Goal(s): Address substance use  Met:  Yes  Target date: by discharge  As evidenced by: completed detox protocol and referred to appropriate treatment  2.  Goal (s): Reduce depressive and anxiety symptoms  Met:  Yes  Target date: by discharge  As evidenced by: Reducing depression from a 10 to a 3 as reported by pt.  Pt rates depression at a 0 and anxiety at a 5 today.    Attendees: Patient:   07/16/2012 9:47 AM   Family:     Physician:  Geoffery Lyons, MD 07/16/2012 9:47 AM   Nursing: Alease Frame, RN 07/16/2012 9:47 AM   Clinical Social Worker:  Reyes Ivan, LCSWA 07/16/2012 9:47 AM   Other:  07/16/2012 9:47 AM   Other:     Other:     Other:     Other:      Scribe for Treatment Team:   Reyes Ivan 07/16/2012 9:47 AM

## 2012-07-16 NOTE — [facility] Suicide Risk Assessment (Signed)
Suicide Risk Assessment  Discharge Assessment     Demographic Factors:  Male, Caucasian and Unemployed  Mental Status Per Nursing Assessment::   On Admission:   (denies si)  Current Mental Status by Physician: In full contact with reality. There are no suicidal ideas, plans or intent. His mood is euthymic. His affect is bright, broad. He is committed to his recovery. Admits that he has not been as committed as he is right now,. He has good support from his girlfriend. He has enough things to stay busy. He has a close relationship with his daughter. He knows the people he needs to avoid.  Loss Factors: NA  Historical Factors: NA  Risk Reduction Factors:   Responsible for children under 62 years of age, Sense of responsibility to family, Living with another person, especially a relative and Positive social support  Continued Clinical Symptoms:  Alcohol/Substance Abuse/Dependencies  Cognitive Features That Contribute To Risk: None   Suicide Risk:  Minimal: No identifiable suicidal ideation.  Patients presenting with no risk factors but with morbid ruminations; may be classified as minimal risk based on the severity of the depressive symptoms  Discharge Diagnoses:   AXIS I:  Alcohol Dependence, Depressive Disorder NOS, PTSD AXIS II:  Deferred AXIS III:   Past Medical History  Diagnosis Date  . Aneurysm   . Seizures     last seizure 2006 alcohol withdrawals   AXIS IV:  None currently AXIS V:  61-70 mild symptoms  Plan Of Care/Follow-up recommendations:  Activity:  As tolerated Diet:  Diet  Is patient on multiple antipsychotic therapies at discharge:  No   Has Patient had three or more failed trials of antipsychotic monotherapy by history:  No  Recommended Plan for Multiple Antipsychotic Therapies: N/A Tameika Heckmann A 07/16/2012, 10:16 AM

## 2012-07-16 NOTE — Social Work (Signed)
[facility] Group Note : Clinical Social Worker Group Therapy  07/16/2012  1:15 PM  Type of Therapy:  Group Therapy  Participation Level:  Appropriate  Participation Quality:  Appropriate   Affect:  Appropriate  Cognitive:  Alert  Insight:  Good  Engagement in Group:  Good  Engagement in Therapy:  Good  Modes of Intervention:  Clarification, Education, Problem-solving, Socialization and Support  Summary of Progress/Problems: The topic for today was feelings about relapse.  Pt discussed what relapse prevention is to them and identified triggers that they are on the path to relapse.  Pt processed their feeling towards relapse and was able to relate to peers.  Pt discussed coping skills that can be used for relapse prevention.      Leonarda Leis Horton, LCSWA 07/16/2012 3:00 pm

## 2012-07-16 NOTE — Progress Notes (Signed)
See d/c note

## 2012-07-16 NOTE — Progress Notes (Signed)
Nursing d/c note- Patient appropriate during d/c process. All f/u reviewed and medication education done. Pt verbalized understanding.  Denies SI. States all treatment goals were met.  Bedside belongings taken and escorted to lobby to care of family.

## 2012-07-18 NOTE — Progress Notes (Signed)
Agree with assessment and plan Eric Manning. Eric Manning, M.D. 

## 2012-07-21 NOTE — Discharge Summary (Signed)
Physician Discharge Summary Note  Patient:  Eric Manning is an 41 y.o., male MRN:  [id number] DOB:  12-26-1970 Patient phone:  [phone] (home)  Patient address:   [address] [place] [address],   Date of Admission:  07/13/2012 Date of Discharge: 07/16/2012  Reason for Admission: Alcohol dependence  Discharge Diagnoses: Active Problems:  Alcohol dependence  Depressive disorder  Post traumatic stress disorder   Axis Diagnosis:  Discharge Diagnoses:  AXIS I: Alcohol Dependence, Depressive Disorder NOS, PTSD  AXIS II: Deferred  AXIS III:  Past Medical History   Diagnosis  Date   .  Aneurysm    .  Seizures      last seizure 2006 alcohol withdrawals   AXIS IV: None currently  AXIS V: 61-70 mild symptoms Level of Care:  OP  Hospital Course:  Eric Manning was admitted for alcohol detox and had started drinking 18 beers a day.  He had a history of seizures with withdrawal.  He was admitted with a BAL of 275 and started on a Librium protocol.  He was evaluated daily by clinical providers. His response to treatment was monitored by CIWA scores and self assessments completed by the patient.  He was offered further rehab upon discharge including in patient rehab, IOP, or other treatment options. He elected to follow up with [facility].  On the day of discharge he reported no further symptoms of withdrawal and was in much improved condition.  He was felt to be stable enough for discharge and was discharged home.  Consults:  None  Significant Diagnostic Studies:  None  Discharge Vitals:   Blood pressure 157/113, pulse 99, temperature 97.4 F (36.3 C), temperature source Oral, resp. rate 16, height 5\' 7"  (1.702 m), weight 83.915 kg (185 lb). Lab Results:   No results found for this or any previous visit (from the past 72 hour(s)).  Physical Findings: AIMS: Facial and Oral Movements Muscles of Facial Expression: None, normal Lips and Perioral Area: None, normal Jaw: None,  normal Tongue: None, normal,Extremity Movements Upper (arms, wrists, hands, fingers): None, normal Lower (legs, knees, ankles, toes): None, normal, Trunk Movements Neck, shoulders, hips: None, normal, Overall Severity Severity of abnormal movements (highest score from questions above): None, normal Incapacitation due to abnormal movements: None, normal Patient's awareness of abnormal movements (rate only patient's report): No Awareness, Dental Status Current problems with teeth and/or dentures?: No Does patient usually wear dentures?: No  CIWA:  CIWA-Ar Total: 2  COWS:  COWS Total Score: 1   Mental Status Exam: See Mental Status Examination and Suicide Risk Assessment completed by Attending Physician prior to discharge.  Discharge destination:  Home  Is patient on multiple antipsychotic therapies at discharge:  No   Has Patient had three or more failed trials of antipsychotic monotherapy by history:  No  Recommended Plan for Multiple Antipsychotic Therapies: N/A  Discharge Orders    Future Orders Please Complete By Expires   Diet - low sodium heart healthy      Increase activity slowly      Discharge instructions      Comments:   Be sure to keep ALL follow up appointments as scheduled. This is to ensure getting your refills on time to avoid any interruption in your medication.  If you find that you can not keep your appointment, call the clinic and reschedule. Be sure to tell the nurse if you will need a refill before your appointment.  90 meetings in 90 days for support!  Medication List   As of 07/21/2012  3:09 PM  STOP taking these medications     GOODY HEADACHE PO       Indication    Gummi Bear Multivitamin/Min Chew   Chew 2 tablets by mouth daily.    Indication: nutritional support     Follow-up Information    Follow up with Eric Manning. On 07/20/2012. (Walk in on this date at 8:00 am with Dr. August Saucer)    Contact information:   [address], [place] [address]  [phone]    Follow-up recommendations:  As above  Comments:    Signed:  Lloyd Huger T. Aniesha Haughn PAC 07/21/2012, 3:09 PM

## 2012-07-21 NOTE — Progress Notes (Signed)
Patient Discharge Instructions:  After Visit Summary (AVS):   Faxed to:  07/21/12 Psychiatric Admission Assessment Note:   Faxed to:  07/21/12 Suicide Risk Assessment - Discharge Assessment:   Faxed to:  07/21/12 Faxed/Sent to the Next Level Care provider:  07/21/12 Faxed to [facility] @ [phone]  Jerelene Redden, 07/21/2012, 3:22 PM

## 2012-08-01 NOTE — Discharge Summary (Signed)
Agree with assessment and plan Bobette Leyh A. Niall Illes, M.D. 

## 2012-08-01 NOTE — Discharge Summary (Signed)
Agree with assessment and plan Krystyna Cleckley A. Arrielle Mcginn, M.D. 

## 2012-08-02 ENCOUNTER — Emergency Department: Payer: Self-pay | Admitting: Emergency Medicine

## 2013-05-04 ENCOUNTER — Encounter (HOSPITAL_COMMUNITY): Payer: Self-pay | Admitting: Emergency Medicine

## 2013-05-04 ENCOUNTER — Emergency Department (HOSPITAL_COMMUNITY)
Admission: EM | Admit: 2013-05-04 | Discharge: 2013-05-04 | Disposition: A | Discharge status: Home / Self Care | Payer: Self-pay | Attending: Emergency Medicine | Admitting: Emergency Medicine

## 2013-05-04 DIAGNOSIS — Z8679 Personal history of other diseases of the circulatory system: Secondary | ICD-10-CM | POA: Insufficient documentation

## 2013-05-04 DIAGNOSIS — Z79899 Other long term (current) drug therapy: Secondary | ICD-10-CM | POA: Insufficient documentation

## 2013-05-04 DIAGNOSIS — F1020 Alcohol dependence, uncomplicated: Secondary | ICD-10-CM | POA: Insufficient documentation

## 2013-05-04 DIAGNOSIS — Z8669 Personal history of other diseases of the nervous system and sense organs: Secondary | ICD-10-CM | POA: Insufficient documentation

## 2013-05-04 HISTORY — DX: Alcohol dependence, uncomplicated: F10.20

## 2013-05-04 MED ORDER — LORAZEPAM 1 MG PO TABS: ORAL_TABLET | ORAL | Status: DC

## 2013-05-04 MED ORDER — LORAZEPAM 1 MG PO TABS: 1.0000 mg | ORAL_TABLET | Freq: Three times a day (TID) | ORAL | Status: DC | PRN

## 2013-05-04 NOTE — ED Provider Notes (Signed)
This chart was scribed for Arthor Captain PA-C, a non-physician practitioner working with Gilda Crease, * by Lewanda Rife, ED Scribe. This patient was seen in room WTR1/WLPT1 and the patient's care was started at 1700.    CSN: [id number]     Arrival date & time 05/04/13  1604 History   First MD Initiated Contact with Patient 05/04/13 1621     Chief Complaint  Patient presents with  . Medical Clearance   (Consider location/radiation/quality/duration/timing/severity/associated sxs/prior Treatment) The history is provided by the patient and the spouse.   HPI Comments: Eric Manning is a 42 y.o. male who presents to the Emergency Department wanting detox from alcohol onset today. Reports he drinks 20 beers a day. Reports he was sober for 1 year and relapsed 2 months ago. Denies any precipitating factors. States last drink was 8 years ago. Denies having problems with DUIs or employment.. Reports he has battled alcoholism for 10 years. Denies any suicidal ideations and homicidal ideations. Denies any visual and auditory hallucinations. Denies having any medical complaints. Reports associated seizure secondary to alcohol in 2006.    Past Medical History  Diagnosis Date  . Aneurysm   . Seizures     last seizure 2006 alcohol withdrawals  . Alcoholism    Past Surgical History  Procedure Laterality Date  . Skin graft    . Rotator cuff surgery     No family history on file. History  Substance Use Topics  . Smoking status: Never Smoker   . Smokeless tobacco: Not on file  . Alcohol Use: 0.6 oz/week    12-18 Cans of beer, 1 Shots of liquor per week     Comment: daily beer 18 cans or more; liquor occasionally    Review of Systems  Constitutional: Negative for fever and chills.  Respiratory: Negative for cough and shortness of breath.   Cardiovascular: Negative for chest pain and palpitations.  Gastrointestinal: Negative for vomiting, abdominal pain, diarrhea and constipation.   Genitourinary: Negative for dysuria, urgency and frequency.  Musculoskeletal: Negative for myalgias and arthralgias.  Skin: Negative for rash.  Neurological: Negative for tremors, seizures, speech difficulty and headaches.  Psychiatric/Behavioral: Negative for suicidal ideas, hallucinations, confusion, sleep disturbance and agitation. The patient is not nervous/anxious.     Allergies  Review of patient's allergies indicates no known allergies.  Home Medications   Current Outpatient Rx  Name  Route  Sig  Dispense  Refill  . Multiple Vitamins-Minerals (ADULT GUMMY) CHEW   Oral   Chew 1 each by mouth daily.         . ranitidine (ZANTAC) 150 MG tablet   Oral   Take 150 mg by mouth 2 (two) times daily.          BP 143/97  Pulse 96  Temp(Src) 98 F (36.7 C) (Oral)  Resp 20  SpO2 94% Physical Exam  Nursing note and vitals reviewed. Constitutional: He is oriented to person, place, and time. He appears well-developed and well-nourished. No distress.  HENT:  Head: Normocephalic and atraumatic.  Eyes: Conjunctivae and EOM are normal.  Neck: Neck supple. No tracheal deviation present.  Cardiovascular: Normal rate, regular rhythm and normal heart sounds.   No murmur heard. Pulmonary/Chest: Effort normal and breath sounds normal. No respiratory distress. He has no wheezes. He has no rales.  Abdominal: Soft. There is no tenderness.  Musculoskeletal: Normal range of motion.  Neurological: He is alert and oriented to person, place, and time.  Skin: Skin is warm  and dry.  Psychiatric: He has a normal mood and affect. His behavior is normal.    ED Course  Procedures (including critical care time) Medications - No data to display  Labs Review Labs Reviewed - No data to display Imaging Review No results found.  MDM   1. Alcohol dependence    Patient with etoh abuse. He wants to detox at home with ativan. I have discussed this with Dr. Blinda Leatherwood who feels this with will be  okay as long as the patient is made aware that he is unable to consume alcohol with the ativan and the patient has no signs or sxs of withdraeal. Patient has stable vitals. No htn, tachycardia, fever, tremors. Patient given outpatient resources and for NA, IOP. I have discussed return precautions and strong warnings no to drink alcohol with ativan. The patient appears reasonably screened and/or stabilized for discharge and I doubt any other medical condition or other [facility] requiring further screening, evaluation, or treatment in the ED at this time prior to discharge.   I personally performed the services described in this documentation, which was scribed in my presence. The recorded information has been reviewed and is accurate.     Arthor Captain, PA-C 05/09/13 1912

## 2013-05-04 NOTE — ED Notes (Signed)
Pt states he wants detox from alcohol.  Last drink was 8 am.  States he drinks 16 beers a day.  Was clean for a year and recently relapsed.  Has had problems with alcohol for 10 years.  Denies SI/HI.

## 2013-05-11 NOTE — ED Provider Notes (Signed)
Medical screening examination/treatment/procedure(s) were performed by non-physician practitioner and as supervising physician I was immediately available for consultation/collaboration.    Shakela Donati J. Caress Reffitt, MD 05/11/13 1310 

## 2013-05-28 ENCOUNTER — Emergency Department: Payer: Self-pay | Admitting: Emergency Medicine

## 2013-05-28 LAB — URINALYSIS, COMPLETE
Bacteria: NONE SEEN
Glucose,UR: NEGATIVE mg/dL (ref 0–75)
Ketone: NEGATIVE
Leukocyte Esterase: NEGATIVE
Protein: NEGATIVE
RBC,UR: NONE SEEN /HPF (ref 0–5)
WBC UR: 1 /HPF (ref 0–5)

## 2013-12-25 ENCOUNTER — Emergency Department: Payer: Self-pay | Admitting: Emergency Medicine

## 2013-12-25 LAB — COMPREHENSIVE METABOLIC PANEL
ALBUMIN: 4 g/dL (ref 3.4–5.0)
ANION GAP: 5 — AB (ref 7–16)
AST: 25 U/L (ref 15–37)
Alkaline Phosphatase: 63 U/L
BUN: 16 mg/dL (ref 7–18)
Bilirubin,Total: 0.3 mg/dL (ref 0.2–1.0)
CALCIUM: 8.9 mg/dL (ref 8.5–10.1)
Chloride: 104 mmol/L (ref 98–107)
Co2: 28 mmol/L (ref 21–32)
Creatinine: 1.25 mg/dL (ref 0.60–1.30)
EGFR (Non-African Amer.): >60
GLUCOSE: 83 mg/dL (ref 65–99)
OSMOLALITY: 274 (ref 275–301)
POTASSIUM: 3.9 mmol/L (ref 3.5–5.1)
SGPT (ALT): 24 U/L (ref 12–78)
Sodium: 137 mmol/L (ref 136–145)
TOTAL PROTEIN: 7.6 g/dL (ref 6.4–8.2)

## 2013-12-25 LAB — CBC WITH DIFFERENTIAL/PLATELET
BASOS ABS: 0.1 10*3/uL (ref 0.0–0.1)
BASOS PCT: 1.2 %
EOS ABS: 0.1 10*3/uL (ref 0.0–0.7)
Eosinophil %: 2.1 %
HCT: 40.5 % (ref 40.0–52.0)
HGB: 13 g/dL (ref 13.0–18.0)
LYMPHS ABS: 1.7 10*3/uL (ref 1.0–3.6)
Lymphocyte %: 38.3 %
MCH: 27.8 pg (ref 26.0–34.0)
MCHC: 32.1 g/dL (ref 32.0–36.0)
MCV: 87 fL (ref 80–100)
MONOS PCT: 11 %
Monocyte #: 0.5 x10 3/mm (ref 0.2–1.0)
Neutrophil #: 2.2 10*3/uL (ref 1.4–6.5)
Neutrophil %: 47.4 %
Platelet: 244 10*3/uL (ref 150–440)
RBC: 4.67 10*6/uL (ref 4.40–5.90)
RDW: 14.9 % — ABNORMAL HIGH (ref 11.5–14.5)
WBC: 4.6 10*3/uL (ref 3.8–10.6)

## 2013-12-25 LAB — URINALYSIS, COMPLETE
BILIRUBIN, UR: NEGATIVE
BLOOD: NEGATIVE
Bacteria: NONE SEEN
Glucose,UR: NEGATIVE mg/dL (ref 0–75)
Ketone: NEGATIVE
Leukocyte Esterase: NEGATIVE
Nitrite: NEGATIVE
Ph: 7 (ref 4.5–8.0)
Protein: NEGATIVE
RBC,UR: NONE SEEN /HPF (ref 0–5)
SPECIFIC GRAVITY: 1.008 (ref 1.003–1.030)
WBC UR: NONE SEEN /HPF (ref 0–5)

## 2013-12-25 LAB — LIPASE, BLOOD: Lipase: 127 U/L (ref 73–393)

## 2016-12-07 ENCOUNTER — Emergency Department
Admission: EM | Admit: 2016-12-07 | Discharge: 2016-12-07 | Disposition: A | Discharge status: Home / Self Care | Payer: Self-pay | Attending: Emergency Medicine | Admitting: Emergency Medicine

## 2016-12-07 DIAGNOSIS — K0381 Cracked tooth: Secondary | ICD-10-CM | POA: Insufficient documentation

## 2016-12-07 DIAGNOSIS — S025XXA Fracture of tooth (traumatic), initial encounter for closed fracture: Secondary | ICD-10-CM

## 2016-12-07 MED ORDER — IBUPROFEN 600 MG PO TABS: 600.0000 mg | ORAL_TABLET | Freq: Three times a day (TID) | ORAL | 0 refills | Status: AC | PRN

## 2016-12-07 MED ORDER — IBUPROFEN 100 MG/5ML PO SUSP: 5.0000 mg/kg | Freq: Once | ORAL | Status: DC

## 2016-12-07 MED ORDER — IBUPROFEN 600 MG PO TABS
600.0000 mg | ORAL_TABLET | Freq: Once | ORAL | Status: AC
  Administered 2016-12-07: 600 mg via ORAL
  Filled 2016-12-07: qty 1

## 2016-12-07 MED ORDER — OXYCODONE-ACETAMINOPHEN 5-325 MG PO TABS: 1.0000 | ORAL_TABLET | Freq: Four times a day (QID) | ORAL | 0 refills | Status: DC | PRN

## 2016-12-07 MED ORDER — OXYCODONE-ACETAMINOPHEN 5-325 MG PO TABS
1.0000 | ORAL_TABLET | Freq: Once | ORAL | Status: AC
  Administered 2016-12-07: 1 via ORAL
  Filled 2016-12-07: qty 1

## 2016-12-07 MED ORDER — AMOXICILLIN 500 MG PO CAPS: 500.0000 mg | ORAL_CAPSULE | Freq: Three times a day (TID) | ORAL | 0 refills | Status: DC

## 2016-12-07 NOTE — ED Provider Notes (Signed)
[facility] Emergency Department Provider Note   ____________________________________________   First MD Initiated Contact with Patient 12/07/16 2200     (approximate)  I have reviewed the triage vital signs and the nursing notes.   HISTORY  Chief Complaint Dental Pain    HPI Eric Manning is a 46 y.o. male is complaining of dental pain to the right upper tooth for 4 days. He stated tooth crack for over a year. Patient's redness pain is 8/10. Patient had a pain as "achy". No palliative measures for his complaint.   Past Medical History:  Diagnosis Date  . Alcoholism   . Aneurysm   . Seizures    last seizure 2006 alcohol withdrawals    Patient Active Problem List   Diagnosis Date Noted  . Alcohol dependence (HCC) 07/13/2012  . Depressive disorder 07/13/2012  . Post traumatic stress disorder 07/13/2012    Past Surgical History:  Procedure Laterality Date  . rotator cuff surgery    . SKIN GRAFT      Prior to Admission medications   Medication Sig Start Date End Date Taking? Authorizing Provider  amoxicillin (AMOXIL) 500 MG capsule Take 1 capsule (500 mg total) by mouth 3 (three) times daily. 12/07/16   Joni Reining, PA-C  ibuprofen (ADVIL,MOTRIN) 600 MG tablet Take 1 tablet (600 mg total) by mouth every 8 (eight) hours as needed. 12/07/16   Joni Reining, PA-C  LORazepam (ATIVAN) 1 MG tablet Take 4 the 1st day Take 3 the 2nd day Take 2 the 3rd day Take 1 the 4th day 05/04/13   Arthor Captain, PA-C  Multiple Vitamins-Minerals (ADULT GUMMY) CHEW Chew 1 each by mouth daily.    Historical Provider, MD  oxyCODONE-acetaminophen (ROXICET) 5-325 MG tablet Take 1 tablet by mouth every 6 (six) hours as needed for moderate pain. 12/07/16   Joni Reining, PA-C  ranitidine (ZANTAC) 150 MG tablet Take 150 mg by mouth 2 (two) times daily.    Historical Provider, MD    Allergies Patient has no known allergies.  No family history on file.  Social  History Social History  Substance Use Topics  . Smoking status: Never Smoker  . Smokeless tobacco: Not on file  . Alcohol use 0.6 oz/week    12 - 18 Cans of beer, 1 Shots of liquor per week     Comment: daily beer 18 cans or more; liquor occasionally    Review of Systems Constitutional: No fever/chills Eyes: No visual changes. ENT: No sore throat.Right upper tooth pain Cardiovascular: Denies chest pain. Respiratory: Denies shortness of breath. Gastrointestinal: No abdominal pain.  No nausea, no vomiting.  No diarrhea.  No constipation. Genitourinary: Negative for dysuria. Musculoskeletal: Negative for back pain. Skin: Negative for rash. Neurological: Negative for headaches, focal weakness or numbness.   ____________________________________________   PHYSICAL EXAM:  VITAL SIGNS: ED Triage Vitals   Enc Vitals Group     BP (!) 147/91     Pulse Rate 62     Resp 16     Temp 97.7 F (36.5 C)     Temp Source Oral     SpO2 100 %     Weight 160 lb (72.6 kg)     Height  (1.753 m)     Head Circumference      Peak Flow      Pain Score 8     Pain Loc      Pain Edu?  Excl. in GC?     Constitutional: Alert and oriented. Well appearing and in no acute distress. Eyes: Conjunctivae are normal. PERRL. EOMI. Head: Atraumatic. Nose: No congestion/rhinnorhea. Mouth/Throat: Mucous membranes are moist.  Oropharynx non-erythematous. Neck: No stridor.  No cervical spine tenderness to palpation. Hematological/Lymphatic/Immunilogical: No cervical lymphadenopathy. Cardiovascular: Normal rate, regular rhythm. Grossly normal heart sounds.  Good peripheral circulation. Respiratory: Normal respiratory effort.  No retractions. Lungs CTAB. Gastrointestinal: Soft and nontender. No distention. No abdominal bruits. No CVA tenderness. Musculoskeletal: No lower extremity tenderness nor edema.  No joint effusions. Neurologic:  Normal speech and language. No gross focal neurologic deficits  are appreciated. No gait instability. Skin:  Skin is warm, dry and intact. No rash noted. Psychiatric: Mood and affect are normal. Speech and behavior are normal.  ____________________________________________   LABS (all labs ordered are listed, but only abnormal results are displayed)  Labs Reviewed - No data to display ____________________________________________  EKG   ____________________________________________  RADIOLOGY   ____________________________________________   PROCEDURES  Procedure(s) performed: None  Procedures  Critical Care performed: No  ____________________________________________   INITIAL IMPRESSION / ASSESSMENT AND PLAN / ED COURSE  Pertinent labs & imaging results that were available during my care of the patient were reviewed by me and considered in my medical decision making (see chart for details).  Dental pain secondary to fractured tooth. Patient given discharge care instructions. Patient given instructions to follow up with the walk-in dental clinic tomorrow morning. Patient given a prescription for 2 days of Percocet, 5 days ibuprofen, and amoxicillin.      ____________________________________________   FINAL CLINICAL IMPRESSION(S) / ED DIAGNOSES  Final diagnoses:  Closed fracture of tooth, initial encounter      NEW MEDICATIONS STARTED DURING THIS VISIT:  New Prescriptions   AMOXICILLIN (AMOXIL) 500 MG CAPSULE    Take 1 capsule (500 mg total) by mouth 3 (three) times daily.   IBUPROFEN (ADVIL,MOTRIN) 600 MG TABLET    Take 1 tablet (600 mg total) by mouth every 8 (eight) hours as needed.   OXYCODONE-ACETAMINOPHEN (ROXICET) 5-325 MG TABLET    Take 1 tablet by mouth every 6 (six) hours as needed for moderate pain.     Note:  This document was prepared using Dragon voice recognition software and may include unintentional dictation errors.    Joni Reining, PA-C 12/07/16 2209    Jene Every, MD 12/07/16 2221

## 2016-12-07 NOTE — ED Triage Notes (Signed)
Reports left upper tooth pain.

## 2016-12-07 NOTE — ED Notes (Signed)

## 2017-01-01 ENCOUNTER — Emergency Department: Payer: Self-pay

## 2017-01-01 ENCOUNTER — Inpatient Hospital Stay
Admission: EM | Admit: 2017-01-01 | Discharge: 2017-01-03 | DRG: 897 | Disposition: A | Discharge status: Home / Self Care | Payer: Self-pay | Attending: Internal Medicine | Admitting: Internal Medicine

## 2017-01-01 ENCOUNTER — Encounter: Payer: Self-pay | Admitting: Emergency Medicine

## 2017-01-01 DIAGNOSIS — Z79899 Other long term (current) drug therapy: Secondary | ICD-10-CM

## 2017-01-01 DIAGNOSIS — R079 Chest pain, unspecified: Secondary | ICD-10-CM | POA: Diagnosis present

## 2017-01-01 DIAGNOSIS — F1020 Alcohol dependence, uncomplicated: Secondary | ICD-10-CM | POA: Diagnosis present

## 2017-01-01 DIAGNOSIS — F329 Major depressive disorder, single episode, unspecified: Secondary | ICD-10-CM | POA: Diagnosis present

## 2017-01-01 DIAGNOSIS — F10239 Alcohol dependence with withdrawal, unspecified: Principal | ICD-10-CM | POA: Diagnosis present

## 2017-01-01 DIAGNOSIS — F10939 Alcohol use, unspecified with withdrawal, unspecified: Secondary | ICD-10-CM | POA: Diagnosis present

## 2017-01-01 DIAGNOSIS — F4310 Post-traumatic stress disorder, unspecified: Secondary | ICD-10-CM | POA: Diagnosis present

## 2017-01-01 DIAGNOSIS — K7010 Alcoholic hepatitis without ascites: Secondary | ICD-10-CM | POA: Diagnosis present

## 2017-01-01 DIAGNOSIS — F10232 Alcohol dependence with withdrawal with perceptual disturbance: Secondary | ICD-10-CM

## 2017-01-01 DIAGNOSIS — Z792 Long term (current) use of antibiotics: Secondary | ICD-10-CM

## 2017-01-01 LAB — CBC WITH DIFFERENTIAL/PLATELET
BASOS ABS: 0.1 10*3/uL (ref 0–0.1)
BASOS PCT: 1 %
EOS PCT: 0 %
Eosinophils Absolute: 0 10*3/uL (ref 0–0.7)
HEMATOCRIT: 43.2 % (ref 40.0–52.0)
Hemoglobin: 14.9 g/dL (ref 13.0–18.0)
LYMPHS PCT: 20 %
Lymphs Abs: 1.7 10*3/uL (ref 1.0–3.6)
MCH: 31.8 pg (ref 26.0–34.0)
MCHC: 34.5 g/dL (ref 32.0–36.0)
MCV: 92 fL (ref 80.0–100.0)
MONO ABS: 0.6 10*3/uL (ref 0.2–1.0)
MONOS PCT: 7 %
NEUTROS ABS: 6.1 10*3/uL (ref 1.4–6.5)
Neutrophils Relative %: 72 %
PLATELETS: 160 10*3/uL (ref 150–440)
RBC: 4.7 MIL/uL (ref 4.40–5.90)
RDW: 13.9 % (ref 11.5–14.5)
WBC: 8.5 10*3/uL (ref 3.8–10.6)

## 2017-01-01 LAB — COMPREHENSIVE METABOLIC PANEL
ALT: 113 U/L — ABNORMAL HIGH (ref 17–63)
AST: 156 U/L — AB (ref 15–41)
Albumin: 4.4 g/dL (ref 3.5–5.0)
Alkaline Phosphatase: 74 U/L (ref 38–126)
Anion gap: 14 (ref 5–15)
BUN: 12 mg/dL (ref 6–20)
CHLORIDE: 100 mmol/L — AB (ref 101–111)
CO2: 23 mmol/L (ref 22–32)
Calcium: 8.8 mg/dL — ABNORMAL LOW (ref 8.9–10.3)
Creatinine, Ser: 0.89 mg/dL (ref 0.61–1.24)
Glucose, Bld: 111 mg/dL — ABNORMAL HIGH (ref 65–99)
POTASSIUM: 3.9 mmol/L (ref 3.5–5.1)
Sodium: 137 mmol/L (ref 135–145)
TOTAL PROTEIN: 7.8 g/dL (ref 6.5–8.1)
Total Bilirubin: 0.9 mg/dL (ref 0.3–1.2)

## 2017-01-01 LAB — URINE DRUG SCREEN, QUALITATIVE (ARMC ONLY)
Amphetamines, Ur Screen: NOT DETECTED
BARBITURATES, UR SCREEN: NOT DETECTED
Benzodiazepine, Ur Scrn: POSITIVE — AB
CANNABINOID 50 NG, UR ~~LOC~~: NOT DETECTED
COCAINE METABOLITE, UR ~~LOC~~: NOT DETECTED
MDMA (ECSTASY) UR SCREEN: NOT DETECTED
METHADONE SCREEN, URINE: NOT DETECTED
Opiate, Ur Screen: POSITIVE — AB
Phencyclidine (PCP) Ur S: NOT DETECTED
TRICYCLIC, UR SCREEN: NOT DETECTED

## 2017-01-01 LAB — TROPONIN I: Troponin I: <0.03 ng/mL (ref <0.03)

## 2017-01-01 LAB — URINALYSIS, COMPLETE (UACMP) WITH MICROSCOPIC
BILIRUBIN URINE: NEGATIVE
Bacteria, UA: NONE SEEN
Glucose, UA: NEGATIVE mg/dL
HGB URINE DIPSTICK: NEGATIVE
KETONES UR: 5 mg/dL — AB
LEUKOCYTES UA: NEGATIVE
Nitrite: NEGATIVE
PH: 7 (ref 5.0–8.0)
Protein, ur: NEGATIVE mg/dL
SPECIFIC GRAVITY, URINE: 1.019 (ref 1.005–1.030)

## 2017-01-01 LAB — ETHANOL: Alcohol, Ethyl (B): 52 mg/dL — ABNORMAL HIGH (ref <5)

## 2017-01-01 LAB — ACETAMINOPHEN LEVEL

## 2017-01-01 LAB — SALICYLATE LEVEL

## 2017-01-01 MED ORDER — LORAZEPAM 2 MG/ML IJ SOLN
INTRAMUSCULAR | Status: AC
  Administered 2017-01-01: 2 mg via INTRAVENOUS
  Filled 2017-01-01: qty 1

## 2017-01-01 MED ORDER — CHLORDIAZEPOXIDE HCL 25 MG PO CAPS
25.0000 mg | ORAL_CAPSULE | Freq: Three times a day (TID) | ORAL | Status: DC
  Administered 2017-01-01 – 2017-01-03 (×5): 25 mg via ORAL
  Filled 2017-01-01 (×5): qty 1

## 2017-01-01 MED ORDER — FAMOTIDINE 20 MG PO TABS
20.0000 mg | ORAL_TABLET | Freq: Two times a day (BID) | ORAL | Status: DC
  Administered 2017-01-01 – 2017-01-03 (×5): 20 mg via ORAL
  Filled 2017-01-01 (×5): qty 1

## 2017-01-01 MED ORDER — ENOXAPARIN SODIUM 40 MG/0.4ML ~~LOC~~ SOLN
40.0000 mg | SUBCUTANEOUS | Status: DC
  Administered 2017-01-01: 40 mg via SUBCUTANEOUS
  Filled 2017-01-01 (×2): qty 0.4

## 2017-01-01 MED ORDER — VITAMIN B-1 100 MG PO TABS
100.0000 mg | ORAL_TABLET | Freq: Every day | ORAL | Status: DC
  Administered 2017-01-02 – 2017-01-03 (×2): 100 mg via ORAL
  Filled 2017-01-01 (×4): qty 1

## 2017-01-01 MED ORDER — ADULT GUMMY PO CHEW: 1.0000 | CHEWABLE_TABLET | Freq: Every day | ORAL | Status: DC

## 2017-01-01 MED ORDER — LORAZEPAM 2 MG/ML IJ SOLN: 0.0000 mg | Freq: Two times a day (BID) | INTRAMUSCULAR | Status: DC

## 2017-01-01 MED ORDER — THIAMINE HCL 100 MG/ML IJ SOLN
100.0000 mg | Freq: Every day | INTRAMUSCULAR | Status: DC
  Administered 2017-01-01: 100 mg via INTRAVENOUS
  Filled 2017-01-01: qty 2

## 2017-01-01 MED ORDER — THIAMINE HCL 100 MG/ML IJ SOLN: 100.0000 mg | Freq: Every day | INTRAMUSCULAR | Status: DC

## 2017-01-01 MED ORDER — LORAZEPAM 2 MG/ML IJ SOLN
2.0000 mg | Freq: Once | INTRAMUSCULAR | Status: AC
  Administered 2017-01-01: 2 mg via INTRAVENOUS
  Filled 2017-01-01: qty 1

## 2017-01-01 MED ORDER — ONDANSETRON HCL 4 MG/2ML IJ SOLN
INTRAMUSCULAR | Status: AC
  Administered 2017-01-01: 4 mg via INTRAVENOUS
  Filled 2017-01-01: qty 2

## 2017-01-01 MED ORDER — ACETAMINOPHEN 325 MG PO TABS
650.0000 mg | ORAL_TABLET | Freq: Four times a day (QID) | ORAL | Status: DC | PRN
  Administered 2017-01-01 – 2017-01-02 (×2): 650 mg via ORAL
  Filled 2017-01-01 (×2): qty 2

## 2017-01-01 MED ORDER — ACETAMINOPHEN 650 MG RE SUPP: 650.0000 mg | Freq: Four times a day (QID) | RECTAL | Status: DC | PRN

## 2017-01-01 MED ORDER — ADULT MULTIVITAMIN W/MINERALS CH
1.0000 | ORAL_TABLET | Freq: Every day | ORAL | Status: DC
  Administered 2017-01-01 – 2017-01-03 (×3): 1 via ORAL
  Filled 2017-01-01 (×3): qty 1

## 2017-01-01 MED ORDER — PNEUMOCOCCAL VAC POLYVALENT 25 MCG/0.5ML IJ INJ
0.5000 mL | INJECTION | INTRAMUSCULAR | Status: AC
  Administered 2017-01-02: 0.5 mL via INTRAMUSCULAR
  Filled 2017-01-01: qty 0.5

## 2017-01-01 MED ORDER — ONDANSETRON HCL 4 MG PO TABS: 4.0000 mg | ORAL_TABLET | Freq: Four times a day (QID) | ORAL | Status: DC | PRN

## 2017-01-01 MED ORDER — ONDANSETRON HCL 4 MG/2ML IJ SOLN
4.0000 mg | Freq: Once | INTRAMUSCULAR | Status: AC
  Administered 2017-01-01: 4 mg via INTRAVENOUS

## 2017-01-01 MED ORDER — VITAMIN B-1 100 MG PO TABS: 100.0000 mg | ORAL_TABLET | Freq: Every day | ORAL | Status: DC

## 2017-01-01 MED ORDER — FLUOXETINE HCL 10 MG PO CAPS
10.0000 mg | ORAL_CAPSULE | Freq: Every day | ORAL | Status: DC
  Administered 2017-01-01 – 2017-01-03 (×3): 10 mg via ORAL
  Filled 2017-01-01 (×3): qty 1

## 2017-01-01 MED ORDER — SODIUM CHLORIDE 0.9 % IV BOLUS (SEPSIS)
1000.0000 mL | Freq: Once | INTRAVENOUS | Status: AC
  Administered 2017-01-01: 1000 mL via INTRAVENOUS

## 2017-01-01 MED ORDER — FOLIC ACID 1 MG PO TABS
1.0000 mg | ORAL_TABLET | Freq: Every day | ORAL | Status: DC
  Administered 2017-01-01 – 2017-01-03 (×3): 1 mg via ORAL
  Filled 2017-01-01 (×3): qty 1

## 2017-01-01 MED ORDER — LORAZEPAM 1 MG PO TABS
1.0000 mg | ORAL_TABLET | Freq: Four times a day (QID) | ORAL | Status: DC | PRN
  Administered 2017-01-01 – 2017-01-02 (×2): 1 mg via ORAL
  Filled 2017-01-01 (×2): qty 1

## 2017-01-01 MED ORDER — LORAZEPAM 2 MG/ML IJ SOLN
0.0000 mg | Freq: Four times a day (QID) | INTRAMUSCULAR | Status: AC
  Administered 2017-01-01 – 2017-01-02 (×4): 2 mg via INTRAVENOUS
  Administered 2017-01-02: 1 mg via INTRAVENOUS
  Administered 2017-01-02: 2 mg via INTRAVENOUS
  Administered 2017-01-02: 4 mg via INTRAVENOUS
  Filled 2017-01-01: qty 1
  Filled 2017-01-01 (×2): qty 2

## 2017-01-01 MED ORDER — LORAZEPAM 2 MG/ML IJ SOLN
1.0000 mg | Freq: Four times a day (QID) | INTRAMUSCULAR | Status: DC | PRN
  Administered 2017-01-02 – 2017-01-03 (×2): 1 mg via INTRAVENOUS
  Filled 2017-01-01 (×4): qty 1

## 2017-01-01 MED ORDER — ONDANSETRON HCL 4 MG/2ML IJ SOLN: 4.0000 mg | Freq: Four times a day (QID) | INTRAMUSCULAR | Status: DC | PRN

## 2017-01-01 MED ORDER — SODIUM CHLORIDE 0.9% FLUSH
3.0000 mL | Freq: Two times a day (BID) | INTRAVENOUS | Status: DC
  Administered 2017-01-01 – 2017-01-02 (×4): 3 mL via INTRAVENOUS

## 2017-01-01 NOTE — ED Notes (Signed)
Pt given urinal at bedside, unable to provide sample at this time.

## 2017-01-01 NOTE — ED Provider Notes (Signed)
[facility] Emergency Department Provider Note  ____________________________________________   First MD Initiated Contact with Patient 01/01/17 [id number]     (approximate)  I have reviewed the triage vital signs and the nursing notes.   HISTORY  Chief Complaint Alcohol Problem   HPI Eric Manning is a 46 y.o. male history of alcoholism who is presenting to the emergency department today for alcohol withdrawal. Per EMS, his last drink was 24 hours ago when he drank rubbing alcohol. The patient denies drinking anything else such as any freeze or methanol. He says that he feels achy all over including some aching chest pain which is nonradiating. He does not report any shortness of breath. Says that he usually drinks as much as he can get per day. Does not quantify the amount of alcohol or beer. Denies any drug use in addition to his alcohol abuse.Says that he has seized in the past when withdrawing.   Past Medical History:  Diagnosis Date  . Alcoholism (HCC)   . Aneurysm (HCC)   . Seizures (HCC)    last seizure 2006 alcohol withdrawals    Patient Active Problem List   Diagnosis Date Noted  . Alcohol dependence (HCC) 07/13/2012  . Depressive disorder 07/13/2012  . Post traumatic stress disorder 07/13/2012    Past Surgical History:  Procedure Laterality Date  . rotator cuff surgery    . SKIN GRAFT      Prior to Admission medications   Medication Sig Start Date End Date Taking? Authorizing Provider  amoxicillin (AMOXIL) 500 MG capsule Take 1 capsule (500 mg total) by mouth 3 (three) times daily. 12/07/16   Joni Reining, PA-C  ibuprofen (ADVIL,MOTRIN) 600 MG tablet Take 1 tablet (600 mg total) by mouth every 8 (eight) hours as needed. 12/07/16   Joni Reining, PA-C  LORazepam (ATIVAN) 1 MG tablet Take 4 the 1st day Take 3 the 2nd day Take 2 the 3rd day Take 1 the 4th day 05/04/13   Arthor Captain, PA-C  Multiple Vitamins-Minerals (ADULT GUMMY) CHEW Chew  1 each by mouth daily.    Historical Provider, MD  oxyCODONE-acetaminophen (ROXICET) 5-325 MG tablet Take 1 tablet by mouth every 6 (six) hours as needed for moderate pain. 12/07/16   Joni Reining, PA-C  ranitidine (ZANTAC) 150 MG tablet Take 150 mg by mouth 2 (two) times daily.    Historical Provider, MD    Allergies Patient has no known allergies.  History reviewed. No pertinent family history.  Social History Social History  Substance Use Topics  . Smoking status: Never Smoker  . Smokeless tobacco: Never Used  . Alcohol use 0.6 oz/week    12 - 18 Cans of beer, 1 Shots of liquor per week     Comment: daily beer 18 cans or more; liquor occasionally, "as much as I can get"    Review of Systems  Constitutional: No fever/chills Eyes: No visual changes. ENT: No sore throat. Cardiovascular: aching chest pain Respiratory: Denies shortness of breath. Gastrointestinal: No abdominal pain.  No nausea, no vomiting.  No diarrhea.  No constipation. Genitourinary: Negative for dysuria. Musculoskeletal: Negative for back pain. Skin: Negative for rash. Neurological: Negative for headaches, focal weakness or numbness.   ____________________________________________   PHYSICAL EXAM:  VITAL SIGNS: ED Triage Vitals  Enc Vitals Group     BP 01/01/17 0833 (!) 157/98     Pulse Rate 01/01/17 0833 (!) 126     Resp 01/01/17 0833 13  Temp 01/01/17 0833 97.8 F (36.6 C)     Temp Source 01/01/17 0833 Oral     SpO2 01/01/17 0833 100 %     Weight 01/01/17 0834 170 lb (77.1 kg)     Height 01/01/17 0834  (1.753 m)     Head Circumference --      Peak Flow --      Pain Score 01/01/17 0831 8     Pain Loc --      Pain Edu? --      Excl. in GC? --     Constitutional: Alert and oriented. She is tremulous in both his hands and his lower extremities. Eyes: Conjunctivae are normal. PERRL. EOMI. Head: Atraumatic. Nose: No congestion/rhinnorhea. Mouth/Throat: Mucous membranes are moist.     Neck: No stridor.   Cardiovascular: Tachycardic, regular rhythm. Grossly normal heart sounds.   Respiratory: Normal respiratory effort.  No retractions. Lungs CTAB. Gastrointestinal: Soft and nontender. No distention. Musculoskeletal: No lower extremity tenderness nor edema.  No joint effusions. Neurologic:  Normal speech and language. No gross focal neurologic deficits are appreciated.  Skin:  Skin is warm, dry and intact. No rash noted. Psychiatric: Mood and affect are normal. Speech and behavior are normal.  ____________________________________________   LABS (all labs ordered are listed, but only abnormal results are displayed)  Labs Reviewed  COMPREHENSIVE METABOLIC PANEL - Abnormal; Notable for the following:       Result Value   Chloride 100 (*)    Glucose, Bld 111 (*)    Calcium 8.8 (*)    AST 156 (*)    ALT 113 (*)    All other components within normal limits  CBC WITH DIFFERENTIAL/PLATELET  ACETAMINOPHEN LEVEL  SALICYLATE LEVEL  URINE DRUG SCREEN, QUALITATIVE ([facility] ONLY)  URINALYSIS, COMPLETE ([facility]) WITH MICROSCOPIC  ETHANOL  TROPONIN I   ____________________________________________  EKG  ED ECG REPORT I, Arelia Longest, the attending physician, personally viewed and interpreted this ECG.   Date: 01/01/2017  EKG Time: 0836  Rate: 123  Rhythm: sinus tachycardia  Axis: normal  Intervals:none  ST&T Change: No ST segment elevation or depression. No abnormal T-wave inversion.  ____________________________________________  RADIOLOGY  DG Chest Port 1 View (Final result)  Result time 01/01/17 09:24:34  Final result by Princella Pellegrini, MD (01/01/17 09:24:34)           Narrative:   CLINICAL DATA: 46 year old male with chest pain, body aches, ETOH withdrawal.  EXAM: PORTABLE CHEST 1 VIEW  COMPARISON: Chest CT 06/24/2005 and earlier.  FINDINGS: Portable AP upright view at 0912 hours. Normal lung volumes. Mediastinal contours are within normal  limits. Visualized tracheal air column is within normal limits. Allowing for portable technique the lungs are clear. No pneumothorax or pleural effusion. Negative visible bowel gas pattern. Chronic healed left midshaft clavicle fracture. No acute osseous abnormality identified.  IMPRESSION: Negative portable chest.   Electronically Signed By: Odessa Fleming M.D. On: 01/01/2017 09:24          ____________________________________________   PROCEDURES  Procedure(s) performed:   Procedures  Critical Care performed:   ____________________________________________   INITIAL IMPRESSION / ASSESSMENT AND PLAN / ED COURSE  Pertinent labs & imaging results that were available during my care of the patient were reviewed by me and considered in my medical decision making (see chart for details).  ----------------------------------------- 9:30 AM on 01/01/2017 -----------------------------------------  Asian given 2 mg of Ativan and improved.  Heart rate down to 112. Still with mild hypertension  but 142/96 at this time.    ----------------------------------------- 10:42 AM on 01/01/2017 -----------------------------------------  Patient still with intermittent tremulousness. Heart rate intermittently to the 120 units. Will be admitted to the hospital for alcohol withdrawal. Patient is understanding of this plan and willing to comply. Signed out to Dr. Cherlynn Kaiser.  ____________________________________________   FINAL CLINICAL IMPRESSION(S) / ED DIAGNOSES  Final diagnoses:  Chest pain  Alcohol withdrawal.    NEW MEDICATIONS STARTED DURING THIS VISIT:  New Prescriptions   No medications on file     Note:  This document was prepared using Dragon voice recognition software and may include unintentional dictation errors.    Myrna Blazer, MD 01/01/17 [phone]

## 2017-01-01 NOTE — ED Notes (Signed)
Pt reports intermittent chest pain x1 week. EDP notified.

## 2017-01-01 NOTE — Consult Note (Signed)
[facility] Consult   Reason for Consult:  Consult for 46 year old man with a history of alcohol dependence came into the hospital today requesting detox Referring Physician:  Sainani Patient Identification: Eric Manning MRN:  [id number] Principal Diagnosis: Alcohol withdrawal [facility]) Diagnosis:   Patient Active Problem List   Diagnosis Date Noted  . Alcohol withdrawal ([place]) [F10.239] 01/01/2017  . Alcohol dependence ([place]) [F10.20] 07/13/2012  . Depressive disorder [F32.9] 07/13/2012  . Post traumatic stress disorder [F43.10] 07/13/2012    Total Time spent with patient: 45 minutes  Subjective:   Eric Manning is a 46 y.o. male patient admitted with "I've been shaking".  HPI:  Patient interviewed. Chart reviewed. 46 year old man with a history about call dependence. He reports that he has been drinking very heavily at least for the last month or so. Has reported that he drinks not only regular alcohol but rubbing alcohol at times. Patient is feeling sick to his stomach but not throwing up today. He is already having visual hallucinations around the room. Feeling very shaky all over and nervous with an impending sense of doom. Denies suicidal or homicidal ideation. Has been cooperative with treatment.  Substance abuse history: Long-standing alcohol abuse. Says that he's had up to 10 years sober in the past but that was many years ago. Also has a positive history of delirium tremens and seizures in the past.  Medical history: Other than alcohol abuse seems to be fairly stable right now. Elevated liver enzymes.  Social history: Patient says that he does have a place to stay with "a guy".  Past Psychiatric History: Patient has had a past psychiatric admission at [facility] for detox. Has engaged in substance abuse treatment in the past. Is on Prozac for chronic anxiety and a diagnosis of PTSD as well. Denies history of suicide attempts  Risk to Self: Is  patient at risk for suicide?: No Risk to Others:   Prior Inpatient Therapy:   Prior Outpatient Therapy:    Past Medical History:  Past Medical History:  Diagnosis Date  . Alcoholism ([place])   . Aneurysm ([place])   . Seizures ([place])    last seizure 2006 alcohol withdrawals    Past Surgical History:  Procedure Laterality Date  . rotator cuff surgery    . SKIN GRAFT     Family History:  Family History  Problem Relation Age of Onset  . Diabetes Neg Hx   . Hypertension Neg Hx    Family Psychiatric  History: None known except for alcohol abuse in several family members including his father Social History:  History  Alcohol Use  . 0.6 oz/week  . 12 - 18 Cans of beer, 1 Shots of liquor per week    Comment: daily beer 18 cans or more; liquor occasionally, "as much as I can get"     History  Drug Use  . Types: Cocaine    Comment: 1 week ago. Snorted Cocaine.     Social History   Social History  . Marital status: Divorced    Spouse name: N/A  . Number of children: N/A  . Years of education: N/A   Social History Main Topics  . Smoking status: Never Smoker  . Smokeless tobacco: Never Used  . Alcohol use 0.6 oz/week    12 - 18 Cans of beer, 1 Shots of liquor per week     Comment: daily beer 18 cans or more; liquor occasionally, "as much as I can get"  .  Drug use: Yes    Types: Cocaine     Comment: 1 week ago. Snorted Cocaine.   . Sexual activity: Yes    Birth control/ protection: None   Other Topics Concern  . None   Social History Narrative  . None   Additional Social History:    Allergies:  No Known Allergies  Labs:  Results for orders placed or performed during the hospital encounter of 01/01/17 (from the past 48 hour(s))  Acetaminophen level     Status: Abnormal   Collection Time: 01/01/17  8:34 AM  Result Value Ref Range   Acetaminophen (Tylenol), Serum <10 (L) 10 - 30 ug/mL    Comment:        THERAPEUTIC CONCENTRATIONS VARY SIGNIFICANTLY. A RANGE OF  10-30 ug/mL MAY BE AN EFFECTIVE CONCENTRATION FOR MANY PATIENTS. HOWEVER, SOME ARE BEST TREATED AT CONCENTRATIONS OUTSIDE THIS RANGE. ACETAMINOPHEN CONCENTRATIONS >150 ug/mL AT 4 HOURS AFTER INGESTION AND >50 ug/mL AT 12 HOURS AFTER INGESTION ARE OFTEN ASSOCIATED WITH TOXIC REACTIONS.   Salicylate level     Status: None   Collection Time: 01/01/17  8:34 AM  Result Value Ref Range   Salicylate Lvl <8.2 2.8 - 30.0 mg/dL  CBC with Differential     Status: None   Collection Time: 01/01/17  8:34 AM  Result Value Ref Range   WBC 8.5 3.8 - 10.6 K/uL   RBC 4.70 4.40 - 5.90 MIL/uL   Hemoglobin 14.9 13.0 - 18.0 g/dL   HCT 43.2 40.0 - 52.0 %   MCV 92.0 80.0 - 100.0 fL   MCH 31.8 26.0 - 34.0 pg   MCHC 34.5 32.0 - 36.0 g/dL   RDW 13.9 11.5 - 14.5 %   Platelets 160 150 - 440 K/uL   Neutrophils Relative % 72 %   Neutro Abs 6.1 1.4 - 6.5 K/uL   Lymphocytes Relative 20 %   Lymphs Abs 1.7 1.0 - 3.6 K/uL   Monocytes Relative 7 %   Monocytes Absolute 0.6 0.2 - 1.0 K/uL   Eosinophils Relative 0 %   Eosinophils Absolute 0.0 0 - 0.7 K/uL   Basophils Relative 1 %   Basophils Absolute 0.1 0 - 0.1 K/uL  Comprehensive metabolic panel     Status: Abnormal   Collection Time: 01/01/17  8:34 AM  Result Value Ref Range   Sodium 137 135 - 145 mmol/L   Potassium 3.9 3.5 - 5.1 mmol/L   Chloride 100 (L) 101 - 111 mmol/L   CO2 23 22 - 32 mmol/L   Glucose, Bld 111 (H) 65 - 99 mg/dL   BUN 12 6 - 20 mg/dL   Creatinine, Ser 0.89 0.61 - 1.24 mg/dL   Calcium 8.8 (L) 8.9 - 10.3 mg/dL   Total Protein 7.8 6.5 - 8.1 g/dL   Albumin 4.4 3.5 - 5.0 g/dL   AST 156 (H) 15 - 41 U/L   ALT 113 (H) 17 - 63 U/L   Alkaline Phosphatase 74 38 - 126 U/L   Total Bilirubin 0.9 0.3 - 1.2 mg/dL   GFR calc non Af Amer >60 >60 mL/min   GFR calc Af Amer >60 >60 mL/min    Comment: (NOTE) The eGFR has been calculated using the CKD EPI equation. This calculation has not been validated in all clinical situations. eGFR's  persistently <60 mL/min signify possible Chronic Kidney Disease.    Anion gap 14 5 - 15  Ethanol     Status: Abnormal   Collection Time: 01/01/17  8:34 AM  Result Value Ref Range   Alcohol, Ethyl (B) 52 (H) <5 mg/dL    Comment:        LOWEST DETECTABLE LIMIT FOR SERUM ALCOHOL IS 5 mg/dL FOR MEDICAL PURPOSES ONLY   Troponin I     Status: None   Collection Time: 01/01/17  8:34 AM  Result Value Ref Range   Troponin I <0.03 <0.03 ng/mL    Current Facility-Administered Medications  Medication Dose Route Frequency Provider Last Rate Last Dose  . acetaminophen (TYLENOL) tablet 650 mg  650 mg Oral Q6H PRN Henreitta Leber, MD   650 mg at 01/01/17 1728   Or  . acetaminophen (TYLENOL) suppository 650 mg  650 mg Rectal Q6H PRN Henreitta Leber, MD      . chlordiazePOXIDE (LIBRIUM) capsule 25 mg  25 mg Oral TID Gonzella Lex, MD      . enoxaparin (LOVENOX) injection 40 mg  40 mg Subcutaneous Q24H Henreitta Leber, MD      . famotidine (PEPCID) tablet 20 mg  20 mg Oral BID Henreitta Leber, MD   20 mg at 01/01/17 1424  . FLUoxetine (PROZAC) capsule 10 mg  10 mg Oral Daily Henreitta Leber, MD   10 mg at 01/01/17 1435  . folic acid (FOLVITE) tablet 1 mg  1 mg Oral Daily Henreitta Leber, MD   1 mg at 01/01/17 1425  . LORazepam (ATIVAN) injection 0-4 mg  0-4 mg Intravenous Q6H Orbie Pyo, MD   2 mg at 01/01/17 1720   Followed by  . [START ON 01/03/2017] LORazepam (ATIVAN) injection 0-4 mg  0-4 mg Intravenous Q12H Orbie Pyo, MD      . LORazepam (ATIVAN) tablet 1 mg  1 mg Oral Q6H PRN Henreitta Leber, MD   1 mg at 01/01/17 1425   Or  . LORazepam (ATIVAN) injection 1 mg  1 mg Intravenous Q6H PRN Henreitta Leber, MD      . multivitamin with minerals tablet 1 tablet  1 tablet Oral Daily Henreitta Leber, MD   1 tablet at 01/01/17 1424  . ondansetron (ZOFRAN) tablet 4 mg  4 mg Oral Q6H PRN Henreitta Leber, MD       Or  . ondansetron (ZOFRAN) injection 4 mg  4 mg Intravenous Q6H  PRN Henreitta Leber, MD      . Derrill Memo ON 01/02/2017] pneumococcal 23 valent vaccine (PNU-IMMUNE) injection 0.5 mL  0.5 mL Intramuscular Tomorrow-1000 Henreitta Leber, MD      . sodium chloride flush (NS) 0.9 % injection 3 mL  3 mL Intravenous Q12H Henreitta Leber, MD   3 mL at 01/01/17 1435  . thiamine (VITAMIN B-1) tablet 100 mg  100 mg Oral Daily Orbie Pyo, MD       Or  . thiamine (B-1) injection 100 mg  100 mg Intravenous Daily Orbie Pyo, MD   100 mg at 01/01/17 0903    Musculoskeletal: Strength & Muscle Tone: spastic Gait & Station: ataxic Patient leans: N/A  Psychiatric Specialty Exam: Physical Exam  ROS  Blood pressure 139/78, pulse (!) 123, temperature 97.5 F (36.4 C), temperature source Oral, resp. rate 18, height _0  (1.753 m), weight 77.1 kg (170 lb), SpO2 100 %.Body mass index is 25.1 kg/m.  General Appearance: Disheveled  Eye Contact:  Fair  Speech:  Slurred  Volume:  Decreased  Mood:  Anxious  Affect:  Constricted  Thought Process:  Goal Directed  Orientation:  Full (Time, Place, and Person)  Thought Content:  Rumination and Tangential  Suicidal Thoughts:  No  Homicidal Thoughts:  No  Memory:  Immediate;   Fair Recent;   Fair Remote;   Fair  Judgement:  Fair  Insight:  Fair  Psychomotor Activity:  Decreased  Concentration:  Concentration: Fair  Recall:  [employer] of Knowledge:  Fair  Language:  Fair  Akathisia:  No  Handed:  Right  AIMS (if indicated):     Assets:  Desire for Improvement Resilience  ADL's:  Impaired  Cognition:  Impaired,  Mild  Sleep:        Treatment Plan Summary: Plan This a 46 year old man with alcohol abuse currently in the hospital for withdrawal. Right now he is very shaky somewhat confused intermittently in and out with his concentration and having visual hallucinations. Based on his presentation and his history I think this patient is at fairly high risk of both seizures and delirium tremens over  the next couple days. I recommend aggressive treatment with benzodiazepines and I ordered another 2 mg of Ativan 1 to be given after I saw him. I am also putting in an order of Librium 25 mg 3 times a day for the next 2 days. If the patient starts to get worse specifically if he has seizures or becomes delirious and agitated I would not hesitate to consider transfer to the intensive care unit for Precedex drip and aggressive treatment. I will follow-up as needed.  Disposition: Patient does not meet criteria for psychiatric inpatient admission. Supportive therapy provided about ongoing stressors.  Alethia Berthold, MD 01/01/2017 7:03 PM

## 2017-01-01 NOTE — H&P (Signed)
[facility] - [place] at [facility]   PATIENT NAME: Eric Manning    MR#:  [id number]  DATE OF BIRTH:  1970-09-16  DATE OF ADMISSION:  01/01/2017  PRIMARY CARE PHYSICIAN: No PCP Per Patient   REQUESTING/REFERRING PHYSICIAN: Dr. Gladstone Pih  CHIEF COMPLAINT:   Chief Complaint  Patient presents with  . Alcohol Problem    HISTORY OF PRESENT ILLNESS:  Eric Manning  is a 46 y.o. male with a known history of Alcohol abuse, history of alcohol withdrawal seizures who presents to the hospital due to nausea vomiting and tremors and needing help with alcohol detox. Patient says that he has been drinking heavily for the past 2 weeks has last drink being 24 hours ago. Patient says usually drinks liquor. He admits to vomiting which is consistent with alcohol he drinks but nonbloody. He denies any abdominal pain, diarrhea, fever, chills, chest pain, shortness of breath, headache, dizziness or any other associated symptoms. Patient was noted to be tachycardic and going through alcohol withdrawal and therefore hospitalist services were contacted further treatment and evaluation.  PAST MEDICAL HISTORY:   Past Medical History:  Diagnosis Date  . Alcoholism ([facility])   . Aneurysm ([facility])   . Seizures ([facility])    last seizure 2006 alcohol withdrawals    PAST SURGICAL HISTORY:   Past Surgical History:  Procedure Laterality Date  . rotator cuff surgery    . SKIN GRAFT      SOCIAL HISTORY:   Social History  Substance Use Topics  . Smoking status: Never Smoker  . Smokeless tobacco: Never Used  . Alcohol use 0.6 oz/week    12 - 18 Cans of beer, 1 Shots of liquor per week     Comment: daily beer 18 cans or more; liquor occasionally, "as much as I can get"    FAMILY HISTORY:   Family History  Problem Relation Age of Onset  . Diabetes Neg Hx   . Hypertension Neg Hx     DRUG ALLERGIES:  No Known Allergies  REVIEW OF SYSTEMS:   Review of Systems  Constitutional: Negative  for fever and weight loss.  HENT: Negative for congestion, nosebleeds and tinnitus.   Eyes: Negative for blurred vision, double vision and redness.  Respiratory: Negative for cough, hemoptysis and shortness of breath.   Cardiovascular: Negative for chest pain, orthopnea, leg swelling and PND.  Gastrointestinal: Negative for abdominal pain, diarrhea, melena, nausea and vomiting.  Genitourinary: Negative for dysuria, hematuria and urgency.  Musculoskeletal: Negative for falls and joint pain.  Neurological: Positive for tremors. Negative for dizziness, tingling, sensory change, focal weakness, seizures, weakness and headaches.  Endo/Heme/Allergies: Negative for polydipsia. Does not bruise/bleed easily.  Psychiatric/Behavioral: Negative for depression and memory loss. The patient is nervous/anxious.     MEDICATIONS AT HOME:   Prior to Admission medications   Medication Sig Start Date End Date Taking? Authorizing Provider  FLUoxetine (PROZAC) 10 MG capsule Take 10 mg by mouth daily.   Yes Historical Provider, MD  ibuprofen (ADVIL,MOTRIN) 600 MG tablet Take 1 tablet (600 mg total) by mouth every 8 (eight) hours as needed. 12/07/16  Yes Joni Reining, PA-C  oxyCODONE-acetaminophen (ROXICET) 5-325 MG tablet Take 1 tablet by mouth every 6 (six) hours as needed for moderate pain. 12/07/16  Yes Joni Reining, PA-C  amoxicillin (AMOXIL) 500 MG capsule Take 1 capsule (500 mg total) by mouth 3 (three) times daily. Patient not taking: Reported on 01/01/2017 12/07/16   Joni Reining, PA-C  LORazepam (ATIVAN) 1 MG tablet Take 4 the 1st day Take 3 the 2nd day Take 2 the 3rd day Take 1 the 4th day Patient not taking: Reported on 01/01/2017 05/04/13   Arthor Captain, PA-C  Multiple Vitamins-Minerals (ADULT GUMMY) CHEW Chew 1 each by mouth daily.    Historical Provider, MD  ranitidine (ZANTAC) 150 MG tablet Take 150 mg by mouth 2 (two) times daily.    Historical Provider, MD      VITAL SIGNS:  Blood pressure  (!) 145/84, pulse (!) 115, temperature 97.8 F (36.6 C), temperature source Oral, resp. rate (!) 24, height  (1.753 m), weight 77.1 kg (170 lb), SpO2 97 %.  PHYSICAL EXAMINATION:  Physical Exam  GENERAL:  45 y.o.-year-old patient lying in the bed in no acute distress.  EYES: Pupils equal, round, reactive to light and accommodation. No scleral icterus. Extraocular muscles intact.  HEENT: Head atraumatic, normocephalic. Oropharynx and nasopharynx clear. No oropharyngeal erythema, moist oral mucosa  NECK:  Supple, no jugular venous distention. No thyroid enlargement, no tenderness.  LUNGS: Normal breath sounds bilaterally, no wheezing, rales, rhonchi. No use of accessory muscles of respiration.  CARDIOVASCULAR: S1, S2 RRR. No murmurs, rubs, gallops, clicks.  ABDOMEN: Soft, nontender, nondistended. Bowel sounds present. No organomegaly or mass.  EXTREMITIES: No pedal edema, cyanosis, or clubbing. + 2 pedal & radial pulses b/l.   NEUROLOGIC: Cranial nerves II through XII are intact. No focal Motor or sensory deficits appreciated b/l. + Tremors PSYCHIATRIC: The patient is alert and oriented x 3.  SKIN: No obvious rash, lesion, or ulcer. Multiple Tattoos over body.   LABORATORY PANEL:   CBC  Recent Labs Lab 01/01/17 0834  WBC 8.5  HGB 14.9  HCT 43.2  PLT 160   ------------------------------------------------------------------------------------------------------------------  Chemistries   Recent Labs Lab 01/01/17 0834  NA 137  K 3.9  CL 100*  CO2 23  GLUCOSE 111*  BUN 12  CREATININE 0.89  CALCIUM 8.8*  AST 156*  ALT 113*  ALKPHOS 74  BILITOT 0.9   ------------------------------------------------------------------------------------------------------------------  Cardiac Enzymes No results for input(s): TROPONINI in the last 168 hours. ------------------------------------------------------------------------------------------------------------------  RADIOLOGY:  Dg  Chest Port 1 View  Result Date: 01/01/2017 CLINICAL DATA:  46 year old male with chest pain, body aches, ETOH withdrawal. EXAM: PORTABLE CHEST 1 VIEW COMPARISON:  Chest CT 06/24/2005 and earlier. FINDINGS: Portable AP upright view at 0912 hours. Normal lung volumes. Mediastinal contours are within normal limits. Visualized tracheal air column is within normal limits. Allowing for portable technique the lungs are clear. No pneumothorax or pleural effusion. Negative visible bowel gas pattern. Chronic healed left midshaft clavicle fracture. No acute osseous abnormality identified. IMPRESSION: Negative portable chest. Electronically Signed   By: Odessa Fleming M.D.   On: 01/01/2017 09:24     IMPRESSION AND PLAN:   46 year old male with past medical history of alcohol abuse, alcohol withdrawal seizures who presents to the hospital due to tremors nausea vomiting and noted to be in alcohol withdrawal.  1. Alcohol withdrawal-this is causing patient's tremors, nausea vomiting. His last drink was 24 hours ago. -We'll place him on CIWA protocol. -Continue thiamine, folate. We'll get a psychiatric consult for substance abuse.  2. History of alcohol withdrawal seizures-continue CIWA protocol.  3. abnormal LFTs-secondary to alcohol abuse. We'll follow LFTs.    All the records are reviewed and case discussed with ED provider. Management plans discussed with the patient, family and they are in agreement.  CODE STATUS: Full code  TOTAL TIME  TAKING CARE OF THIS PATIENT: 40 minutes.    Houston Siren M.D on 01/01/2017 at 10:53 AM  Between 7am to 6pm - Pager - [phone]  After 6pm go to [email or web address] - password [email or web address] [facility]  [facility]  Office  [phone]  CC: Primary care physician; No PCP Per Patient

## 2017-01-01 NOTE — ED Triage Notes (Signed)
Pt presents to ED from home via [facility] c/o alcohol withdrawal. Pt states last drink was 24 hours ago, 1/2 bottle of rubbing alcohol. Pt states he usually drinks "as much as I can get." Reports being sober for 10 years until a few months ago. Presents with generalized body aches and whole body tremors. Hx seizure with EtOH withdrawal.

## 2017-01-01 NOTE — ED Notes (Signed)
Seizure pads in place

## 2017-01-02 ENCOUNTER — Encounter: Payer: Self-pay | Admitting: Internal Medicine

## 2017-01-02 LAB — CBC
HCT: 37 % — ABNORMAL LOW (ref 40.0–52.0)
Hemoglobin: 12.8 g/dL — ABNORMAL LOW (ref 13.0–18.0)
MCH: 31.9 pg (ref 26.0–34.0)
MCHC: 34.6 g/dL (ref 32.0–36.0)
MCV: 92.3 fL (ref 80.0–100.0)
PLATELETS: 132 10*3/uL — AB (ref 150–440)
RBC: 4.01 MIL/uL — ABNORMAL LOW (ref 4.40–5.90)
RDW: 14 % (ref 11.5–14.5)
WBC: 5.2 10*3/uL (ref 3.8–10.6)

## 2017-01-02 LAB — COMPREHENSIVE METABOLIC PANEL
ALBUMIN: 3.5 g/dL (ref 3.5–5.0)
ALT: 79 U/L — ABNORMAL HIGH (ref 17–63)
ANION GAP: 7 (ref 5–15)
AST: 95 U/L — ABNORMAL HIGH (ref 15–41)
Alkaline Phosphatase: 58 U/L (ref 38–126)
BUN: 14 mg/dL (ref 6–20)
CO2: 24 mmol/L (ref 22–32)
Calcium: 8.1 mg/dL — ABNORMAL LOW (ref 8.9–10.3)
Chloride: 104 mmol/L (ref 101–111)
Creatinine, Ser: 0.92 mg/dL (ref 0.61–1.24)
GFR calc Af Amer: >60 mL/min (ref >60)
GFR calc non Af Amer: >60 mL/min (ref >60)
GLUCOSE: 100 mg/dL — AB (ref 65–99)
POTASSIUM: 3.5 mmol/L (ref 3.5–5.1)
SODIUM: 135 mmol/L (ref 135–145)
Total Bilirubin: 1 mg/dL (ref 0.3–1.2)
Total Protein: 6.5 g/dL (ref 6.5–8.1)

## 2017-01-02 LAB — HIV ANTIBODY (ROUTINE TESTING W REFLEX): HIV Screen 4th Generation wRfx: NONREACTIVE

## 2017-01-02 LAB — MAGNESIUM: Magnesium: 2.2 mg/dL (ref 1.7–2.4)

## 2017-01-02 MED ORDER — POTASSIUM CHLORIDE IN NACL 40-0.9 MEQ/L-% IV SOLN
INTRAVENOUS | Status: DC
  Administered 2017-01-02 – 2017-01-03 (×3): 125 mL/h via INTRAVENOUS
  Filled 2017-01-02 (×3): qty 1000

## 2017-01-02 MED ORDER — TRAMADOL HCL 50 MG PO TABS
50.0000 mg | ORAL_TABLET | Freq: Once | ORAL | Status: AC
  Administered 2017-01-02: 50 mg via ORAL
  Filled 2017-01-02: qty 1

## 2017-01-02 NOTE — Progress Notes (Signed)
[facility] - [place] at [facility]   PATIENT NAME: Eric Manning    MR#:  [id number]  DATE OF BIRTH:  March 23, 1971  SUBJECTIVE:  CHIEF COMPLAINT:   Chief Complaint  Patient presents with  . Alcohol Problem    REVIEW OF SYSTEMS:    Review of Systems  Constitutional: Positive for malaise/fatigue. Negative for chills and fever.  HENT: Negative for sore throat.   Eyes: Negative for blurred vision, double vision and pain.  Respiratory: Negative for cough, hemoptysis, shortness of breath and wheezing.   Cardiovascular: Negative for chest pain, palpitations, orthopnea and leg swelling.  Gastrointestinal: Positive for nausea. Negative for abdominal pain, constipation, diarrhea, heartburn and vomiting.  Genitourinary: Negative for dysuria and hematuria.  Musculoskeletal: Negative for back pain and joint pain.  Skin: Negative for rash.  Neurological: Positive for tremors and weakness. Negative for sensory change, speech change, focal weakness and headaches.  Endo/Heme/Allergies: Does not bruise/bleed easily.  Psychiatric/Behavioral: Negative for depression. The patient is not nervous/anxious.     DRUG ALLERGIES:  No Known Allergies  VITALS:  Blood pressure 110/80, pulse 90, temperature 97.8 F (36.6 C), temperature source Oral, resp. rate 18, height  (1.753 m), weight 77.1 kg (170 lb), SpO2 100 %.  PHYSICAL EXAMINATION:   Physical Exam  GENERAL:  46 y.o.-year-old patient lying in the bed. Tremulous. sweating EYES: Pupils equal, round, reactive to light and accommodation. No scleral icterus. Extraocular muscles intact.  HEENT: Head atraumatic, normocephalic. Oropharynx and nasopharynx clear.  NECK:  Supple, no jugular venous distention. No thyroid enlargement, no tenderness.  LUNGS: Normal breath sounds bilaterally, no wheezing, rales, rhonchi. No use of accessory muscles of respiration.  CARDIOVASCULAR: S1, S2 normal. No murmurs, rubs, or gallops.  ABDOMEN:  Soft, nontender, nondistended. Bowel sounds present. No organomegaly or mass.  EXTREMITIES: No cyanosis, clubbing or edema b/l.   Bilateral tremors NEUROLOGIC: Cranial nerves II through XII are intact. No focal Motor or sensory deficits b/l.   PSYCHIATRIC: The patient is alert and oriented x 3.  SKIN: No obvious rash, lesion, or ulcer.   LABORATORY PANEL:   CBC  Recent Labs Lab 01/02/17 0441  WBC 5.2  HGB 12.8*  HCT 37.0*  PLT 132*   ------------------------------------------------------------------------------------------------------------------ Chemistries   Recent Labs Lab 01/02/17 0441  NA 135  K 3.5  CL 104  CO2 24  GLUCOSE 100*  BUN 14  CREATININE 0.92  CALCIUM 8.1*  MG 2.2  AST 95*  ALT 79*  ALKPHOS 58  BILITOT 1.0   ------------------------------------------------------------------------------------------------------------------  Cardiac Enzymes  Recent Labs Lab 01/01/17 0834  TROPONINI <0.03   ------------------------------------------------------------------------------------------------------------------  RADIOLOGY:  Dg Chest Port 1 View  Result Date: 01/01/2017 CLINICAL DATA:  46 year old male with chest pain, body aches, ETOH withdrawal. EXAM: PORTABLE CHEST 1 VIEW COMPARISON:  Chest CT 06/24/2005 and earlier. FINDINGS: Portable AP upright view at 0912 hours. Normal lung volumes. Mediastinal contours are within normal limits. Visualized tracheal air column is within normal limits. Allowing for portable technique the lungs are clear. No pneumothorax or pleural effusion. Negative visible bowel gas pattern. Chronic healed left midshaft clavicle fracture. No acute osseous abnormality identified. IMPRESSION: Negative portable chest. Electronically Signed   By: Odessa Fleming M.D.   On: 01/01/2017 09:24     ASSESSMENT AND PLAN:   46 year old male with past medical history of alcohol abuse, alcohol withdrawal seizures who presents to the hospital due to  tremors nausea vomiting and noted to be in alcohol withdrawal.  * Alcohol  withdrawal. On Librium scheduled. Ativan as needed. Thiamine, folic acid. Telemetry monitoring. Seizure precautions. Appreciate psychiatry input. Will need outpatient resources to help quit alcohol.  * Mild alcoholic hepatitis. Monitor.   All the records are reviewed and case discussed with Care Management/Social Workerr. Management plans discussed with the patient, family and they are in agreement.  CODE STATUS: FULL CODE  DVT Prophylaxis: SCDs  TOTAL TIME TAKING CARE OF THIS PATIENT: 35 minutes.   POSSIBLE D/C IN 1-2 DAYS, DEPENDING ON CLINICAL CONDITION.  Milagros Loll R M.D on 01/02/2017 at 1:31 PM  Between 7am to 6pm - Pager - [phone]  After 6pm go to [email or web address] - password [email or web address] [facility]  [facility]  Office  [phone]  CC: Primary care physician; No PCP Per Patient  Note: This dictation was prepared with Dragon dictation along with smaller phrase technology. Any transcriptional errors that result from this process are unintentional.

## 2017-01-02 NOTE — Progress Notes (Signed)
Pnt c/o elbow pain which is chronic. Called spoke with oncall orders for tramadol given per order. Pnt pre med stated pin 8/10 pnt now reports pain 10/10 not to elbow but tooth pain. Noted pnt has several what appears to be dental caries and missing tooth. Informed Dr. Tobi Bastos of pnt complaints and verbal order for morphine  IV x1. Will continue to monitor and assess.

## 2017-01-02 NOTE — Clinical Social Work Note (Signed)
CSW consulted for active ETOH abuse. Psychiatry assessed patient late yesterday and patient is actively detoxing. CSW will attempt to see patient during a more appropriate time.  York Spaniel MSW,LCSW [phone]

## 2017-01-02 NOTE — Consult Note (Signed)
[facility] Consult   Reason for Consult:  Consult for 46 year old man with a history of alcohol dependence came into the hospital today requesting detox Referring Physician:  Sainani Patient Identification: Eric Manning MRN:  [id number] Principal Diagnosis: Alcohol withdrawal [facility]) Diagnosis:   Patient Active Problem List   Diagnosis Date Noted  . Alcohol withdrawal ([place]) [F10.239] 01/01/2017  . Alcohol dependence ([place]) [F10.20] 07/13/2012  . Depressive disorder [F32.9] 07/13/2012  . Post traumatic stress disorder [F43.10] 07/13/2012    Total Time spent with patient: 15 minutes  Subjective:   Eric Manning is a 46 y.o. male patient admitted with "I've been shaking".  Follow up Friday. No new com[paints. Lucid, no hallucinations. Not agitated. Eating ok.  HPI:  Patient interviewed. Chart reviewed. 46 year old man with a history about call dependence. He reports that he has been drinking very heavily at least for the last month or so. Has reported that he drinks not only regular alcohol but rubbing alcohol at times. Patient is feeling sick to his stomach but not throwing up today. He is already having visual hallucinations around the room. Feeling very shaky all over and nervous with an impending sense of doom. Denies suicidal or homicidal ideation. Has been cooperative with treatment.  Substance abuse history: Long-standing alcohol abuse. Says that he's had up to 10 years sober in the past but that was many years ago. Also has a positive history of delirium tremens and seizures in the past.  Medical history: Other than alcohol abuse seems to be fairly stable right now. Elevated liver enzymes.  Social history: Patient says that he does have a place to stay with "a guy".  Past Psychiatric History: Patient has had a past psychiatric admission at [facility] behavioral health for detox. Has engaged in substance abuse treatment in the past. Is on Prozac for chronic anxiety  and a diagnosis of PTSD as well. Denies history of suicide attempts  Risk to Self: Is patient at risk for suicide?: No Risk to Others:   Prior Inpatient Therapy:   Prior Outpatient Therapy:    Past Medical History:  Past Medical History:  Diagnosis Date  . Alcoholism ([place])   . Aneurysm ([place])   . Seizures ([place])    last seizure 2006 alcohol withdrawals    Past Surgical History:  Procedure Laterality Date  . rotator cuff surgery    . SKIN GRAFT     Family History:  Family History  Problem Relation Age of Onset  . Diabetes Neg Hx   . Hypertension Neg Hx    Family Psychiatric  History: None known except for alcohol abuse in several family members including his father Social History:  History  Alcohol Use  . 0.6 oz/week  . 12 - 18 Cans of beer, 1 Shots of liquor per week    Comment: daily beer 18 cans or more; liquor occasionally, "as much as I can get"     History  Drug Use  . Types: Cocaine    Comment: 1 week ago. Snorted Cocaine.     Social History   Social History  . Marital status: Divorced    Spouse name: N/A  . Number of children: N/A  . Years of education: N/A   Social History Main Topics  . Smoking status: Never Smoker  . Smokeless tobacco: Never Used  . Alcohol use 0.6 oz/week    12 - 18 Cans of beer, 1 Shots of liquor per week     Comment:  daily beer 18 cans or more; liquor occasionally, "as much as I can get"  . Drug use: Yes    Types: Cocaine     Comment: 1 week ago. Snorted Cocaine.   . Sexual activity: Yes    Birth control/ protection: None   Other Topics Concern  . None   Social History Narrative  . None   Additional Social History:    Allergies:  No Known Allergies  Labs:  Results for orders placed or performed during the hospital encounter of 01/01/17 (from the past 48 hour(s))  Urine Drug Screen, Qualitative ([place] only)     Status: Abnormal   Collection Time: 01/01/17  8:29 AM  Result Value Ref Range   Tricyclic, Ur Screen NONE  DETECTED NONE DETECTED   Amphetamines, Ur Screen NONE DETECTED NONE DETECTED   MDMA (Ecstasy)Ur Screen NONE DETECTED NONE DETECTED   Cocaine Metabolite,Ur [place] NONE DETECTED NONE DETECTED   Opiate, Ur Screen POSITIVE (A) NONE DETECTED   Phencyclidine (PCP) Ur S NONE DETECTED NONE DETECTED   Cannabinoid 50 Ng, Ur [place] NONE DETECTED NONE DETECTED   Barbiturates, Ur Screen NONE DETECTED NONE DETECTED   Benzodiazepine, Ur Scrn POSITIVE (A) NONE DETECTED   Methadone Scn, Ur NONE DETECTED NONE DETECTED    Comment: (NOTE) 099  Tricyclics, urine               Cutoff 1000 ng/mL 200  Amphetamines, urine             Cutoff 1000 ng/mL 300  MDMA (Ecstasy), urine           Cutoff 500 ng/mL 400  Cocaine Metabolite, urine       Cutoff 300 ng/mL 500  Opiate, urine                   Cutoff 300 ng/mL 600  Phencyclidine (PCP), urine      Cutoff 25 ng/mL 700  Cannabinoid, urine              Cutoff 50 ng/mL 800  Barbiturates, urine             Cutoff 200 ng/mL 900  Benzodiazepine, urine           Cutoff 200 ng/mL 1000 Methadone, urine                Cutoff 300 ng/mL 1100 1200 The urine drug screen provides only a preliminary, unconfirmed 1300 analytical test result and should not be used for non-medical 1400 purposes. Clinical consideration and professional judgment should 1500 be applied to any positive drug screen result due to possible 1600 interfering substances. A more specific alternate chemical method 1700 must be used in order to obtain a confirmed analytical result.  1800 Gas chromato graphy / mass spectrometry (GC/MS) is the preferred 1900 confirmatory method.   Urinalysis, Complete w Microscopic     Status: Abnormal   Collection Time: 01/01/17  8:29 AM  Result Value Ref Range   Color, Urine YELLOW (A) YELLOW   APPearance CLEAR (A) CLEAR   Specific Gravity, Urine 1.019 1.005 - 1.030   pH 7.0 5.0 - 8.0   Glucose, UA NEGATIVE NEGATIVE mg/dL   Hgb urine dipstick NEGATIVE NEGATIVE   Bilirubin  Urine NEGATIVE NEGATIVE   Ketones, ur 5 (A) NEGATIVE mg/dL   Protein, ur NEGATIVE NEGATIVE mg/dL   Nitrite NEGATIVE NEGATIVE   Leukocytes, UA NEGATIVE NEGATIVE   RBC / HPF 0-5 0 - 5 RBC/hpf   WBC,  UA 0-5 0 - 5 WBC/hpf   Bacteria, UA NONE SEEN NONE SEEN   Squamous Epithelial / LPF 0-5 (A) NONE SEEN  Acetaminophen level     Status: Abnormal   Collection Time: 01/01/17  8:34 AM  Result Value Ref Range   Acetaminophen (Tylenol), Serum <10 (L) 10 - 30 ug/mL    Comment:        THERAPEUTIC CONCENTRATIONS VARY SIGNIFICANTLY. A RANGE OF 10-30 ug/mL MAY BE AN EFFECTIVE CONCENTRATION FOR MANY PATIENTS. HOWEVER, SOME ARE BEST TREATED AT CONCENTRATIONS OUTSIDE THIS RANGE. ACETAMINOPHEN CONCENTRATIONS >150 ug/mL AT 4 HOURS AFTER INGESTION AND >50 ug/mL AT 12 HOURS AFTER INGESTION ARE OFTEN ASSOCIATED WITH TOXIC REACTIONS.   Salicylate level     Status: None   Collection Time: 01/01/17  8:34 AM  Result Value Ref Range   Salicylate Lvl <6.2 2.8 - 30.0 mg/dL  CBC with Differential     Status: None   Collection Time: 01/01/17  8:34 AM  Result Value Ref Range   WBC 8.5 3.8 - 10.6 K/uL   RBC 4.70 4.40 - 5.90 MIL/uL   Hemoglobin 14.9 13.0 - 18.0 g/dL   HCT 43.2 40.0 - 52.0 %   MCV 92.0 80.0 - 100.0 fL   MCH 31.8 26.0 - 34.0 pg   MCHC 34.5 32.0 - 36.0 g/dL   RDW 13.9 11.5 - 14.5 %   Platelets 160 150 - 440 K/uL   Neutrophils Relative % 72 %   Neutro Abs 6.1 1.4 - 6.5 K/uL   Lymphocytes Relative 20 %   Lymphs Abs 1.7 1.0 - 3.6 K/uL   Monocytes Relative 7 %   Monocytes Absolute 0.6 0.2 - 1.0 K/uL   Eosinophils Relative 0 %   Eosinophils Absolute 0.0 0 - 0.7 K/uL   Basophils Relative 1 %   Basophils Absolute 0.1 0 - 0.1 K/uL  Comprehensive metabolic panel     Status: Abnormal   Collection Time: 01/01/17  8:34 AM  Result Value Ref Range   Sodium 137 135 - 145 mmol/L   Potassium 3.9 3.5 - 5.1 mmol/L   Chloride 100 (L) 101 - 111 mmol/L   CO2 23 22 - 32 mmol/L   Glucose, Bld 111 (H)  65 - 99 mg/dL   BUN 12 6 - 20 mg/dL   Creatinine, Ser 0.89 0.61 - 1.24 mg/dL   Calcium 8.8 (L) 8.9 - 10.3 mg/dL   Total Protein 7.8 6.5 - 8.1 g/dL   Albumin 4.4 3.5 - 5.0 g/dL   AST 156 (H) 15 - 41 U/L   ALT 113 (H) 17 - 63 U/L   Alkaline Phosphatase 74 38 - 126 U/L   Total Bilirubin 0.9 0.3 - 1.2 mg/dL   GFR calc non Af Amer >60 >60 mL/min   GFR calc Af Amer >60 >60 mL/min    Comment: (NOTE) The eGFR has been calculated using the CKD EPI equation. This calculation has not been validated in all clinical situations. eGFR's persistently <60 mL/min signify possible Chronic Kidney Disease.    Anion gap 14 5 - 15  Ethanol     Status: Abnormal   Collection Time: 01/01/17  8:34 AM  Result Value Ref Range   Alcohol, Ethyl (B) 52 (H) <5 mg/dL    Comment:        LOWEST DETECTABLE LIMIT FOR SERUM ALCOHOL IS 5 mg/dL FOR MEDICAL PURPOSES ONLY   Troponin I     Status: None   Collection Time: 01/01/17  8:34  AM  Result Value Ref Range   Troponin I <0.03 <0.03 ng/mL  HIV antibody (Routine Testing)     Status: None   Collection Time: 01/01/17  3:44 PM  Result Value Ref Range   HIV Screen 4th Generation wRfx Non Reactive Non Reactive    Comment: (NOTE) Performed At: [facility] [address] [place], [place] [id number] Mila Homer MD FK:[id number]   CBC     Status: Abnormal   Collection Time: 01/02/17  4:41 AM  Result Value Ref Range   WBC 5.2 3.8 - 10.6 K/uL   RBC 4.01 (L) 4.40 - 5.90 MIL/uL   Hemoglobin 12.8 (L) 13.0 - 18.0 g/dL   HCT 39.9 (L) 84.4 - 91.4 %   MCV 92.3 80.0 - 100.0 fL   MCH 31.9 26.0 - 34.0 pg   MCHC 34.6 32.0 - 36.0 g/dL   RDW 07.5 90.0 - 43.5 %   Platelets 132 (L) 150 - 440 K/uL  Comprehensive metabolic panel     Status: Abnormal   Collection Time: 01/02/17  4:41 AM  Result Value Ref Range   Sodium 135 135 - 145 mmol/L   Potassium 3.5 3.5 - 5.1 mmol/L   Chloride 104 101 - 111 mmol/L   CO2 24 22 - 32 mmol/L   Glucose, Bld 100 (H) 65 - 99  mg/dL   BUN 14 6 - 20 mg/dL   Creatinine, Ser 1.99 0.61 - 1.24 mg/dL   Calcium 8.1 (L) 8.9 - 10.3 mg/dL   Total Protein 6.5 6.5 - 8.1 g/dL   Albumin 3.5 3.5 - 5.0 g/dL   AST 95 (H) 15 - 41 U/L   ALT 79 (H) 17 - 63 U/L   Alkaline Phosphatase 58 38 - 126 U/L   Total Bilirubin 1.0 0.3 - 1.2 mg/dL   GFR calc non Af Amer >60 >60 mL/min   GFR calc Af Amer >60 >60 mL/min    Comment: (NOTE) The eGFR has been calculated using the CKD EPI equation. This calculation has not been validated in all clinical situations. eGFR's persistently <60 mL/min signify possible Chronic Kidney Disease.    Anion gap 7 5 - 15  Magnesium     Status: None   Collection Time: 01/02/17  4:41 AM  Result Value Ref Range   Magnesium 2.2 1.7 - 2.4 mg/dL    Current Facility-Administered Medications  Medication Dose Route Frequency Provider Last Rate Last Dose  . 0.9 % NaCl with KCl 40 mEq / L  infusion   Intravenous Continuous Milagros Loll, MD 125 mL/hr at 01/02/17 1444 125 mL/hr at 01/02/17 1444  . acetaminophen (TYLENOL) tablet 650 mg  650 mg Oral Q6H PRN Houston Siren, MD   650 mg at 01/02/17 0012   Or  . acetaminophen (TYLENOL) suppository 650 mg  650 mg Rectal Q6H PRN Houston Siren, MD      . chlordiazePOXIDE (LIBRIUM) capsule 25 mg  25 mg Oral TID Audery Amel, MD   25 mg at 01/02/17 1549  . enoxaparin (LOVENOX) injection 40 mg  40 mg Subcutaneous Q24H Houston Siren, MD   40 mg at 01/01/17 2116  . famotidine (PEPCID) tablet 20 mg  20 mg Oral BID Houston Siren, MD   20 mg at 01/02/17 1000  . FLUoxetine (PROZAC) capsule 10 mg  10 mg Oral Daily Houston Siren, MD   10 mg at 01/02/17 1000  . folic acid (FOLVITE) tablet 1 mg  1 mg  Oral Daily Henreitta Leber, MD   1 mg at 01/02/17 1000  . LORazepam (ATIVAN) injection 0-4 mg  0-4 mg Intravenous Q6H Orbie Pyo, MD   2 mg at 01/02/17 1745   Followed by  . [START ON 01/03/2017] LORazepam (ATIVAN) injection 0-4 mg  0-4 mg Intravenous Q12H Orbie Pyo, MD      . LORazepam (ATIVAN) tablet 1 mg  1 mg Oral Q6H PRN Henreitta Leber, MD   1 mg at 01/02/17 1151   Or  . LORazepam (ATIVAN) injection 1 mg  1 mg Intravenous Q6H PRN Henreitta Leber, MD      . multivitamin with minerals tablet 1 tablet  1 tablet Oral Daily Henreitta Leber, MD   1 tablet at 01/02/17 1000  . ondansetron (ZOFRAN) tablet 4 mg  4 mg Oral Q6H PRN Henreitta Leber, MD       Or  . ondansetron (ZOFRAN) injection 4 mg  4 mg Intravenous Q6H PRN Henreitta Leber, MD      . sodium chloride flush (NS) 0.9 % injection 3 mL  3 mL Intravenous Q12H Henreitta Leber, MD   3 mL at 01/02/17 1000  . thiamine (VITAMIN B-1) tablet 100 mg  100 mg Oral Daily Orbie Pyo, MD   100 mg at 01/02/17 1000   Or  . thiamine (B-1) injection 100 mg  100 mg Intravenous Daily Orbie Pyo, MD   100 mg at 01/01/17 0903    Musculoskeletal: Strength & Muscle Tone: spastic Gait & Station: ataxic Patient leans: N/A  Psychiatric Specialty Exam: Physical Exam   ROS   Blood pressure 110/80, pulse 90, temperature 97.8 F (36.6 C), temperature source Oral, resp. rate 18, height _0  (1.753 m), weight 77.1 kg (170 lb), SpO2 100 %.Body mass index is 25.1 kg/m.  General Appearance: Disheveled  Eye Contact:  Fair  Speech:  Slurred  Volume:  Decreased  Mood:  Anxious  Affect:  Constricted  Thought Process:  Goal Directed  Orientation:  Full (Time, Place, and Person)  Thought Content:  Rumination and Tangential  Suicidal Thoughts:  No  Homicidal Thoughts:  No  Memory:  Immediate;   Fair Recent;   Fair Remote;   Fair  Judgement:  Fair  Insight:  Fair  Psychomotor Activity:  Decreased  Concentration:  Concentration: Fair  Recall:  [employer] of Knowledge:  Fair  Language:  Fair  Akathisia:  No  Handed:  Right  AIMS (if indicated):     Assets:  Desire for Improvement Resilience  ADL's:  Impaired  Cognition:  Impaired,  Mild  Sleep:        Treatment  Plan Summary: Plan No change to detox plan.  Disposition: Patient does not meet criteria for psychiatric inpatient admission. Supportive therapy provided about ongoing stressors.  Alethia Berthold, MD 01/02/2017 5:45 PM

## 2017-01-03 MED ORDER — MORPHINE SULFATE (PF) 2 MG/ML IV SOLN
2.0000 mg | Freq: Once | INTRAVENOUS | Status: AC
  Administered 2017-01-03: 2 mg via INTRAVENOUS
  Filled 2017-01-03: qty 1

## 2017-01-03 MED ORDER — TRAMADOL HCL 50 MG PO TABS
50.0000 mg | ORAL_TABLET | Freq: Four times a day (QID) | ORAL | Status: DC | PRN
  Administered 2017-01-03: 50 mg via ORAL
  Filled 2017-01-03: qty 1

## 2017-01-03 MED ORDER — LORAZEPAM 1 MG PO TABS: ORAL_TABLET | ORAL | 0 refills | Status: AC

## 2017-01-03 NOTE — Discharge Instructions (Signed)
QUIT ALCOHOL ° °Resume diet and activity as before °

## 2017-01-03 NOTE — Progress Notes (Signed)
Pnt is resting does not appear anxious or aggitated. Pnt awake and watching TV. No concerns or s/sx of withdrawal at this time. Bed is low and locked, call bell in reach and bed padded. Pnt significant other at bedside. Will continue to monitor and assess.

## 2017-01-03 NOTE — Care Management Note (Signed)
Case Management Note  Patient Details  Name: NICOLAE VASEK MRN: [id number] Date of Birth: 08-15-1971  Subjective/Objective:          GoodRx coupons provided as well as a list of Substance Abuse community resources were provided.           Action/Plan:   Expected Discharge Date:  01/03/17               Expected Discharge Plan:     In-House Referral:     Discharge planning Services     Post Acute Care Choice:    Choice offered to:     DME Arranged:    DME Agency:     HH Arranged:    HH Agency:     Status of Service:     If discussed at [employer], dates discussed:    Additional Comments:  Derrick Orris A, RN 01/03/2017, 10:54 AM

## 2017-01-06 ENCOUNTER — Inpatient Hospital Stay
Admission: EM | Admit: 2017-01-06 | Discharge: 2017-01-08 | DRG: 897 | Disposition: A | Discharge status: Home / Self Care | Payer: Self-pay | Attending: Internal Medicine | Admitting: Internal Medicine

## 2017-01-06 ENCOUNTER — Encounter: Payer: Self-pay | Admitting: Emergency Medicine

## 2017-01-06 DIAGNOSIS — F329 Major depressive disorder, single episode, unspecified: Secondary | ICD-10-CM | POA: Diagnosis present

## 2017-01-06 DIAGNOSIS — F1020 Alcohol dependence, uncomplicated: Secondary | ICD-10-CM | POA: Diagnosis present

## 2017-01-06 DIAGNOSIS — F4310 Post-traumatic stress disorder, unspecified: Secondary | ICD-10-CM | POA: Diagnosis present

## 2017-01-06 DIAGNOSIS — F10239 Alcohol dependence with withdrawal, unspecified: Principal | ICD-10-CM | POA: Diagnosis present

## 2017-01-06 DIAGNOSIS — X58XXXA Exposure to other specified factors, initial encounter: Secondary | ICD-10-CM | POA: Diagnosis present

## 2017-01-06 DIAGNOSIS — F10232 Alcohol dependence with withdrawal with perceptual disturbance: Secondary | ICD-10-CM

## 2017-01-06 DIAGNOSIS — T6594XA Toxic effect of unspecified substance, undetermined, initial encounter: Secondary | ICD-10-CM | POA: Diagnosis present

## 2017-01-06 DIAGNOSIS — K7010 Alcoholic hepatitis without ascites: Secondary | ICD-10-CM | POA: Diagnosis present

## 2017-01-06 DIAGNOSIS — Z79899 Other long term (current) drug therapy: Secondary | ICD-10-CM

## 2017-01-06 DIAGNOSIS — F10939 Alcohol use, unspecified with withdrawal, unspecified: Secondary | ICD-10-CM

## 2017-01-06 HISTORY — DX: Major depressive disorder, single episode, unspecified: F32.9

## 2017-01-06 HISTORY — DX: Depression, unspecified: F32.A

## 2017-01-06 LAB — CBC
HEMATOCRIT: 39.4 % — AB (ref 40.0–52.0)
HEMOGLOBIN: 13.3 g/dL (ref 13.0–18.0)
MCH: 31.5 pg (ref 26.0–34.0)
MCHC: 33.9 g/dL (ref 32.0–36.0)
MCV: 93.1 fL (ref 80.0–100.0)
Platelets: 211 10*3/uL (ref 150–440)
RBC: 4.23 MIL/uL — ABNORMAL LOW (ref 4.40–5.90)
RDW: 14.3 % (ref 11.5–14.5)
WBC: 6.6 10*3/uL (ref 3.8–10.6)

## 2017-01-06 LAB — URINE DRUG SCREEN, QUALITATIVE (ARMC ONLY)
Amphetamines, Ur Screen: NOT DETECTED
BARBITURATES, UR SCREEN: NOT DETECTED
BENZODIAZEPINE, UR SCRN: POSITIVE — AB
CANNABINOID 50 NG, UR ~~LOC~~: NOT DETECTED
COCAINE METABOLITE, UR ~~LOC~~: POSITIVE — AB
MDMA (Ecstasy)Ur Screen: NOT DETECTED
Methadone Scn, Ur: NOT DETECTED
OPIATE, UR SCREEN: POSITIVE — AB
Phencyclidine (PCP) Ur S: NOT DETECTED
TRICYCLIC, UR SCREEN: NOT DETECTED

## 2017-01-06 LAB — COMPREHENSIVE METABOLIC PANEL
ALK PHOS: 86 U/L (ref 38–126)
ALT: 112 U/L — AB (ref 17–63)
ANION GAP: 11 (ref 5–15)
AST: 77 U/L — ABNORMAL HIGH (ref 15–41)
Albumin: 4.6 g/dL (ref 3.5–5.0)
BILIRUBIN TOTAL: 0.7 mg/dL (ref 0.3–1.2)
BUN: 9 mg/dL (ref 6–20)
CALCIUM: 9.5 mg/dL (ref 8.9–10.3)
CO2: 24 mmol/L (ref 22–32)
Chloride: 101 mmol/L (ref 101–111)
Creatinine, Ser: 1.12 mg/dL (ref 0.61–1.24)
Glucose, Bld: 97 mg/dL (ref 65–99)
Potassium: 3.9 mmol/L (ref 3.5–5.1)
SODIUM: 136 mmol/L (ref 135–145)
TOTAL PROTEIN: 8.1 g/dL (ref 6.5–8.1)

## 2017-01-06 LAB — ACETAMINOPHEN LEVEL

## 2017-01-06 LAB — GLUCOSE, CAPILLARY: Glucose-Capillary: 89 mg/dL (ref 65–99)

## 2017-01-06 LAB — ETHANOL: Alcohol, Ethyl (B): <5 mg/dL (ref <5)

## 2017-01-06 LAB — SALICYLATE LEVEL

## 2017-01-06 MED ORDER — ACETAMINOPHEN 325 MG PO TABS: 650.0000 mg | ORAL_TABLET | Freq: Four times a day (QID) | ORAL | Status: DC | PRN

## 2017-01-06 MED ORDER — ONDANSETRON HCL 4 MG/2ML IJ SOLN: 4.0000 mg | Freq: Four times a day (QID) | INTRAMUSCULAR | Status: DC | PRN

## 2017-01-06 MED ORDER — SODIUM CHLORIDE 0.9% FLUSH: 3.0000 mL | INTRAVENOUS | Status: DC | PRN

## 2017-01-06 MED ORDER — LORAZEPAM 2 MG PO TABS
2.0000 mg | ORAL_TABLET | Freq: Once | ORAL | Status: AC
  Administered 2017-01-06: 2 mg via ORAL
  Filled 2017-01-06: qty 1

## 2017-01-06 MED ORDER — HYDROCODONE-ACETAMINOPHEN 5-325 MG PO TABS
1.0000 | ORAL_TABLET | ORAL | Status: DC | PRN
  Administered 2017-01-06 (×2): 1 via ORAL
  Administered 2017-01-07 – 2017-01-08 (×6): 2 via ORAL
  Filled 2017-01-06: qty 2
  Filled 2017-01-06: qty 1
  Filled 2017-01-06 (×3): qty 2
  Filled 2017-01-06: qty 1
  Filled 2017-01-06: qty 2
  Filled 2017-01-06 (×2): qty 1

## 2017-01-06 MED ORDER — KETOROLAC TROMETHAMINE 30 MG/ML IJ SOLN
30.0000 mg | Freq: Four times a day (QID) | INTRAMUSCULAR | Status: DC | PRN
  Administered 2017-01-07 (×2): 30 mg via INTRAVENOUS
  Filled 2017-01-06 (×2): qty 1

## 2017-01-06 MED ORDER — FOLIC ACID 1 MG PO TABS
1.0000 mg | ORAL_TABLET | Freq: Every day | ORAL | Status: DC
  Administered 2017-01-06 – 2017-01-08 (×3): 1 mg via ORAL
  Filled 2017-01-06 (×3): qty 1

## 2017-01-06 MED ORDER — THIAMINE HCL 100 MG/ML IJ SOLN: 100.0000 mg | Freq: Every day | INTRAMUSCULAR | Status: DC

## 2017-01-06 MED ORDER — ADULT MULTIVITAMIN W/MINERALS CH
1.0000 | ORAL_TABLET | Freq: Every day | ORAL | Status: DC
  Administered 2017-01-06 – 2017-01-08 (×3): 1 via ORAL
  Filled 2017-01-06 (×3): qty 1

## 2017-01-06 MED ORDER — ENOXAPARIN SODIUM 40 MG/0.4ML ~~LOC~~ SOLN
40.0000 mg | SUBCUTANEOUS | Status: DC
  Administered 2017-01-06: 22:00:00 40 mg via SUBCUTANEOUS
  Filled 2017-01-06 (×2): qty 0.4

## 2017-01-06 MED ORDER — SODIUM CHLORIDE 0.9% FLUSH
3.0000 mL | Freq: Two times a day (BID) | INTRAVENOUS | Status: DC
  Administered 2017-01-06 – 2017-01-08 (×4): 3 mL via INTRAVENOUS

## 2017-01-06 MED ORDER — ONDANSETRON HCL 4 MG PO TABS: 4.0000 mg | ORAL_TABLET | Freq: Four times a day (QID) | ORAL | Status: DC | PRN

## 2017-01-06 MED ORDER — ACETAMINOPHEN 650 MG RE SUPP: 650.0000 mg | Freq: Four times a day (QID) | RECTAL | Status: DC | PRN

## 2017-01-06 MED ORDER — SENNOSIDES-DOCUSATE SODIUM 8.6-50 MG PO TABS: 1.0000 | ORAL_TABLET | Freq: Every evening | ORAL | Status: DC | PRN

## 2017-01-06 MED ORDER — VITAMIN B-1 100 MG PO TABS
100.0000 mg | ORAL_TABLET | Freq: Every day | ORAL | Status: DC
  Administered 2017-01-06 – 2017-01-08 (×3): 100 mg via ORAL
  Filled 2017-01-06 (×3): qty 1

## 2017-01-06 MED ORDER — LORAZEPAM 2 MG/ML IJ SOLN
1.0000 mg | Freq: Four times a day (QID) | INTRAMUSCULAR | Status: DC | PRN
  Administered 2017-01-06 – 2017-01-07 (×3): 1 mg via INTRAVENOUS
  Filled 2017-01-06 (×3): qty 1

## 2017-01-06 MED ORDER — CHLORDIAZEPOXIDE HCL 25 MG PO CAPS
25.0000 mg | ORAL_CAPSULE | Freq: Three times a day (TID) | ORAL | Status: AC
Start: 2017-01-06 — End: 2017-01-08
  Administered 2017-01-06 – 2017-01-08 (×5): 25 mg via ORAL
  Filled 2017-01-06 (×5): qty 1

## 2017-01-06 MED ORDER — FLUOXETINE HCL 10 MG PO CAPS
10.0000 mg | ORAL_CAPSULE | Freq: Every day | ORAL | Status: DC
  Administered 2017-01-06 – 2017-01-08 (×3): 10 mg via ORAL
  Filled 2017-01-06 (×3): qty 1

## 2017-01-06 MED ORDER — ALBUTEROL SULFATE (2.5 MG/3ML) 0.083% IN NEBU: 2.5000 mg | INHALATION_SOLUTION | RESPIRATORY_TRACT | Status: DC | PRN

## 2017-01-06 MED ORDER — LORAZEPAM 1 MG PO TABS
1.0000 mg | ORAL_TABLET | Freq: Four times a day (QID) | ORAL | Status: DC | PRN
Start: 2017-01-06 — End: 2017-01-08
  Administered 2017-01-06 – 2017-01-08 (×2): 1 mg via ORAL
  Filled 2017-01-06 (×2): qty 1

## 2017-01-06 MED ORDER — SODIUM CHLORIDE 0.9 % IV SOLN: 250.0000 mL | INTRAVENOUS | Status: DC | PRN

## 2017-01-06 NOTE — ED Triage Notes (Addendum)
Pt to ed with c/o drinking nail polish remover at 8 am, approx 8 ounces.  Pt alert and oriented, and states he drank it to prevent DTs.  Pt states he wants help for alcohol.  Pt denies SI, denies HI.  Pt states last alcohol intake was last night, states "I know I just need to stop drinking so I did"  Pt with severe shaking at triage.  States hx of DTs.

## 2017-01-06 NOTE — ED Notes (Signed)
Eric Manning (girlfriend) [phone]

## 2017-01-06 NOTE — ED Notes (Signed)
MD Clapacs at bedside  

## 2017-01-06 NOTE — BH Assessment (Signed)
Assessment Note  Eric Manning is an 46 y.o. male who presents to the ER due to having concerns about his alcohol use. He denies SI/HI and AV/H. His main concern is is having active DT's.   Diagnosis: Alcohol Use Disorder, Severe  Past Medical History:  Past Medical History:  Diagnosis Date  . Alcoholism (HCC)   . Aneurysm (HCC)   . Depression   . Seizures (HCC)    last seizure 2006 alcohol withdrawals    Past Surgical History:  Procedure Laterality Date  . rotator cuff surgery    . SKIN GRAFT      Family History:  Family History  Problem Relation Age of Onset  . Heart attack Father   . Hypertension Father   . Diabetes Neg Hx     Social History:  reports that he has never smoked. He has never used smokeless tobacco. He reports that he drinks about 0.6 oz of alcohol per week . He reports that he uses drugs, including Cocaine.  Additional Social History:  Alcohol / Drug Use Pain Medications: See PTA Prescriptions: See PTA Over the Counter: See PTA History of alcohol / drug use?: Yes Longest period of sobriety (when/how long): 5 years no drugs/ drinking Negative Consequences of Use: Personal relationships Withdrawal Symptoms: Cramps, Tremors, Nausea / Vomiting, Sweats, Weakness Substance #1 Name of Substance 1: Alcohol 1 - Age of First Use: Before the age of 19 1 - Amount (size/oz): Unable to quantify 1 - Frequency: Daily 1 - Duration: Unable to quantify 1 - Last Use / Amount: 01/06/2014  CIWA: CIWA-Ar BP: 126/72 Pulse Rate: (!) 121 Nausea and Vomiting: no nausea and no vomiting Tactile Disturbances: none Tremor: not visible, but can be felt fingertip to fingertip Auditory Disturbances: not present Paroxysmal Sweats: barely perceptible sweating, palms moist Visual Disturbances: not present Anxiety: mildly anxious Headache, Fullness in Head: moderate Agitation: normal activity Orientation and Clouding of Sensorium: oriented and can do serial additions CIWA-Ar  Total: 6 COWS:    Allergies: No Known Allergies  Home Medications:  Medications Prior to Admission  Medication Sig Dispense Refill  . LORazepam (ATIVAN) 1 MG tablet Take 4 the 1st day Take 3 the 2nd day Take 2 the 3rd day Take 1 the 4th day 10 tablet 0  . FLUoxetine (PROZAC) 10 MG capsule Take 10 mg by mouth daily.    Marland Kitchen ibuprofen (ADVIL,MOTRIN) 600 MG tablet Take 1 tablet (600 mg total) by mouth every 8 (eight) hours as needed. (Patient not taking: Reported on 01/06/2017) 15 tablet 0    OB/GYN Status:  No LMP for male patient.  General Assessment Data Location of Assessment: [facility] ED TTS Assessment: In system Is this a Tele or Face-to-Face Assessment?: Face-to-Face Is this an Initial Assessment or a Re-assessment for this encounter?: Initial Assessment Marital status: Long term relationship Maiden name: n/a Is patient pregnant?: No Pregnancy Status: No Living Arrangements: Spouse/significant other Can pt return to current living arrangement?: Yes Admission Status: Voluntary Is patient capable of signing voluntary admission?: Yes Referral Source: Self/Family/Friend Insurance type: None  Medical Screening Exam [facility] Walk-in ONLY) Medical Exam completed: Yes  Crisis Care Plan Living Arrangements: Spouse/significant other Legal Guardian: Other: (Self) Name of Psychiatrist: Reports of none Name of Therapist: Reports of none  Education Status Is patient currently in school?: No Current Grade: n/a Highest grade of school patient has completed: n/a Name of school: n/a Contact person: n/a  Risk to self with the past 6 months Suicidal Ideation: No  Has patient been a risk to self within the past 6 months prior to admission? : No Suicidal Intent: No Has patient had any suicidal intent within the past 6 months prior to admission? : No Is patient at risk for suicide?: No Suicidal Plan?: No Has patient had any suicidal plan within the past 6 months prior to admission? :  No Access to Means: No What has been your use of drugs/alcohol within the last 12 months?: Alcohol Previous Attempts/Gestures: No How many times?: 0 Other Self Harm Risks: 0 Triggers for Past Attempts: None known Intentional Self Injurious Behavior: None Family Suicide History: No Recent stressful life event(s): Financial Problems, Recent negative physical changes Persecutory voices/beliefs?: No Depression: Yes Depression Symptoms: Feeling worthless/self pity, Isolating, Fatigue Substance abuse history and/or treatment for substance abuse?: Yes Suicide prevention information given to non-admitted patients: Not applicable  Risk to Others within the past 6 months Homicidal Ideation: No Does patient have any lifetime risk of violence toward others beyond the six months prior to admission? : No Thoughts of Harm to Others: No Current Homicidal Intent: No Current Homicidal Plan: No Access to Homicidal Means: No Identified Victim: Reports of none History of harm to others?: No Assessment of Violence: None Noted Violent Behavior Description: Reports of none Does patient have access to weapons?: No Criminal Charges Pending?: No Does patient have a court date: No Is patient on probation?: No  Psychosis Hallucinations: None noted Delusions: None noted  Mental Status Report Appearance/Hygiene: Unremarkable, In scrubs Eye Contact: Fair Motor Activity: Freedom of movement, Unremarkable Speech: Logical/coherent, Unremarkable Level of Consciousness: Alert Mood: Depressed, Anxious, Sad, Pleasant Affect: Anxious, Appropriate to circumstance, Depressed, Sad Anxiety Level: Minimal Thought Processes: Coherent, Relevant Judgement: Unimpaired Orientation: Person, Place, Time, Situation, Appropriate for developmental age Obsessive Compulsive Thoughts/Behaviors: Minimal  Cognitive Functioning Concentration: Normal Memory: Recent Intact, Remote Intact IQ: Average Insight: Fair Impulse  Control: Fair Appetite: Fair Weight Loss: 0 Weight Gain: 0 Sleep: No Change Total Hours of Sleep: 7 Vegetative Symptoms: None  ADLScreening [facility] Assessment Services) Patient's cognitive ability adequate to safely complete daily activities?: Yes Patient able to express need for assistance with ADLs?: Yes Independently performs ADLs?: Yes (appropriate for developmental age)  Prior Inpatient Therapy Prior Inpatient Therapy: Yes Prior Therapy Dates: Unknown Prior Therapy Facilty/Provider(s): "[facility]" Reason for Treatment: Alcohol Use Disorder  Prior Outpatient Therapy Prior Outpatient Therapy: No Prior Therapy Dates: Reports of none Prior Therapy Facilty/Provider(s): Reports of none Reason for Treatment: Reports of none Does patient have an ACCT team?: No Does patient have Intensive In-House Services?  : No Does patient have [facility] services? : No Does patient have P4CC services?: No  ADL Screening (condition at time of admission) Patient's cognitive ability adequate to safely complete daily activities?: Yes Is the patient deaf or have difficulty hearing?: No Does the patient have difficulty seeing, even when wearing glasses/contacts?: No Does the patient have difficulty concentrating, remembering, or making decisions?: No Patient able to express need for assistance with ADLs?: Yes Does the patient have difficulty dressing or bathing?: No Independently performs ADLs?: Yes (appropriate for developmental age) Does the patient have difficulty walking or climbing stairs?: No Weakness of Legs: None Weakness of Arms/Hands: None  Home Assistive Devices/Equipment Home Assistive Devices/Equipment: None  Therapy Consults (therapy consults require a physician order) PT Evaluation Needed: No OT Evalulation Needed: No SLP Evaluation Needed: No Abuse/Neglect Assessment (Assessment to be complete while patient is alone) Physical Abuse: Denies Verbal Abuse: Denies Sexual Abuse:  Denies Exploitation of  patient/patient's resources: Denies Self-Neglect: Denies Values / Beliefs Cultural Requests During Hospitalization: None Spiritual Requests During Hospitalization: None Consults Spiritual Care Consult Needed: No Social Work Consult Needed: No Merchant navy officer (For Healthcare) Does Patient Have a Medical Advance Directive?: No Would patient like information on creating a medical advance directive?: No - Patient declined Nutrition Screen- [facility] Adult/WL/AP Patient's home diet: Regular Has the patient recently lost weight without trying?: No Has the patient been eating poorly because of a decreased appetite?: No Malnutrition Screening Tool Score: 0  Additional Information 1:1 In Past 12 Months?: No CIRT Risk: No Elopement Risk: No Does patient have medical clearance?: Yes  Child/Adolescent Assessment Running Away Risk: Denies (Patient is an adult)  Disposition:  Disposition Initial Assessment Completed for this Encounter: Yes Disposition of Patient: Other dispositions (ER MD Ordered Psych Consult)  On Site Evaluation by:   Reviewed with Physician:    Lilyan Gilford MS, LCAS, LPC, NCC, CCSI Therapeutic Triage Specialist 01/06/2017 7:42 PM

## 2017-01-06 NOTE — Consult Note (Signed)
[facility] Consult   Reason for Consult:  Consult for 46 year old man with a history of alcohol dependence presents to the emergency room in alcohol withdrawal Referring Physician:  Mariea Clonts Patient Identification: TYLAND KLEMENS MRN:  [id number] Principal Diagnosis: Alcohol withdrawal [facility]) Diagnosis:   Patient Active Problem List   Diagnosis Date Noted  . Alcohol withdrawal ([place]) [F10.239] 01/01/2017  . Alcohol dependence ([place]) [F10.20] 07/13/2012  . Depressive disorder [F32.9] 07/13/2012  . Post traumatic stress disorder [F43.10] 07/13/2012    Total Time spent with patient: 45 minutes  Subjective:   EDUAR KUMPF is a 46 y.o. male patient admitted with "I'm trying to stop".  HPI:  Patient interviewed. Chart reviewed. Patient known from previous encounters. 46 year old man with alcohol dependence. He was discharged from the hospital on April 28. Reports that he immediately started drinking heavily. Last drink was yesterday. This morning he says that he drank a bottle of nail polish remover cause he thought that it would help him to not go into DTs. Patient denies any suicidal ideation or intent. Mood is feeling anxious. Having some visual hallucinations but no evidence of delusions. Cooperative with treatment. Not on any psychiatric medicine.  Social history: Has been living in a space that he borrows from a friend. Not able to work regularly. Does have a girlfriend who has some knowledge of his situation.  Medical history: History of recurrent alcohol withdrawal history of seizures history of DTs.  Substance abuse history: Long-standing alcohol abuse with DTs and seizures in the past. Has had some sobriety mostly while actively involved in treatment.  Past Psychiatric History: No history of suicide attempts. Has had a past diagnosis of PTSD but not currently getting any treatment for it. Denies any history of psychotic symptoms mania or violence  Risk to Self: Is  patient at risk for suicide?: No Risk to Others:   Prior Inpatient Therapy:   Prior Outpatient Therapy:    Past Medical History:  Past Medical History:  Diagnosis Date  . Alcoholism ([place])   . Aneurysm ([place])   . Depression   . Seizures ([place])    last seizure 2006 alcohol withdrawals    Past Surgical History:  Procedure Laterality Date  . rotator cuff surgery    . SKIN GRAFT     Family History:  Family History  Problem Relation Age of Onset  . Heart attack Father   . Hypertension Father   . Diabetes Neg Hx    Family Psychiatric  History: Positive for alcohol abuse Social History:  History  Alcohol Use  . 0.6 oz/week  . 12 - 18 Cans of beer, 1 Shots of liquor per week    Comment: daily beer 18 cans or more; liquor occasionally, "as much as I can get"     History  Drug Use  . Types: Cocaine    Comment: 1 week ago. Snorted Cocaine.     Social History   Social History  . Marital status: Divorced    Spouse name: N/A  . Number of children: N/A  . Years of education: N/A   Social History Main Topics  . Smoking status: Never Smoker  . Smokeless tobacco: Never Used  . Alcohol use 0.6 oz/week    12 - 18 Cans of beer, 1 Shots of liquor per week     Comment: daily beer 18 cans or more; liquor occasionally, "as much as I can get"  . Drug use: Yes    Types: Cocaine  Comment: 1 week ago. Snorted Cocaine.   . Sexual activity: Yes    Birth control/ protection: None   Other Topics Concern  . None   Social History Narrative  . None   Additional Social History:    Allergies:  No Known Allergies  Labs:  Results for orders placed or performed during the hospital encounter of 01/06/17 (from the past 48 hour(s))  Comprehensive metabolic panel     Status: Abnormal   Collection Time: 01/06/17  1:07 PM  Result Value Ref Range   Sodium 136 135 - 145 mmol/L   Potassium 3.9 3.5 - 5.1 mmol/L   Chloride 101 101 - 111 mmol/L   CO2 24 22 - 32 mmol/L   Glucose, Bld 97 65 -  99 mg/dL   BUN 9 6 - 20 mg/dL   Creatinine, Ser 1.12 0.61 - 1.24 mg/dL   Calcium 9.5 8.9 - 10.3 mg/dL   Total Protein 8.1 6.5 - 8.1 g/dL   Albumin 4.6 3.5 - 5.0 g/dL   AST 77 (H) 15 - 41 U/L   ALT 112 (H) 17 - 63 U/L   Alkaline Phosphatase 86 38 - 126 U/L   Total Bilirubin 0.7 0.3 - 1.2 mg/dL   GFR calc non Af Amer >60 >60 mL/min   GFR calc Af Amer >60 >60 mL/min    Comment: (NOTE) The eGFR has been calculated using the CKD EPI equation. This calculation has not been validated in all clinical situations. eGFR's persistently <60 mL/min signify possible Chronic Kidney Disease.    Anion gap 11 5 - 15  Ethanol     Status: None   Collection Time: 01/06/17  1:07 PM  Result Value Ref Range   Alcohol, Ethyl (B) <5 <5 mg/dL    Comment:        LOWEST DETECTABLE LIMIT FOR SERUM ALCOHOL IS 5 mg/dL FOR MEDICAL PURPOSES ONLY   Salicylate level     Status: None   Collection Time: 01/06/17  1:07 PM  Result Value Ref Range   Salicylate Lvl <0.0 2.8 - 30.0 mg/dL  Acetaminophen level     Status: Abnormal   Collection Time: 01/06/17  1:07 PM  Result Value Ref Range   Acetaminophen (Tylenol), Serum <10 (L) 10 - 30 ug/mL    Comment:        THERAPEUTIC CONCENTRATIONS VARY SIGNIFICANTLY. A RANGE OF 10-30 ug/mL MAY BE AN EFFECTIVE CONCENTRATION FOR MANY PATIENTS. HOWEVER, SOME ARE BEST TREATED AT CONCENTRATIONS OUTSIDE THIS RANGE. ACETAMINOPHEN CONCENTRATIONS >150 ug/mL AT 4 HOURS AFTER INGESTION AND >50 ug/mL AT 12 HOURS AFTER INGESTION ARE OFTEN ASSOCIATED WITH TOXIC REACTIONS.   cbc     Status: Abnormal   Collection Time: 01/06/17  1:07 PM  Result Value Ref Range   WBC 6.6 3.8 - 10.6 K/uL   RBC 4.23 (L) 4.40 - 5.90 MIL/uL   Hemoglobin 13.3 13.0 - 18.0 g/dL   HCT 39.4 (L) 40.0 - 52.0 %   MCV 93.1 80.0 - 100.0 fL   MCH 31.5 26.0 - 34.0 pg   MCHC 33.9 32.0 - 36.0 g/dL   RDW 14.3 11.5 - 14.5 %   Platelets 211 150 - 440 K/uL  Glucose, capillary     Status: None   Collection Time:  01/06/17  1:42 PM  Result Value Ref Range   Glucose-Capillary 89 65 - 99 mg/dL   Comment 1 Notify RN     No current facility-administered medications for this encounter.    Current  Outpatient Prescriptions  Medication Sig Dispense Refill  . LORazepam (ATIVAN) 1 MG tablet Take 4 the 1st day Take 3 the 2nd day Take 2 the 3rd day Take 1 the 4th day 10 tablet 0  . FLUoxetine (PROZAC) 10 MG capsule Take 10 mg by mouth daily.    Marland Kitchen ibuprofen (ADVIL,MOTRIN) 600 MG tablet Take 1 tablet (600 mg total) by mouth every 8 (eight) hours as needed. (Patient not taking: Reported on 01/06/2017) 15 tablet 0    Musculoskeletal: Strength & Muscle Tone: decreased Gait & Station: unable to stand Patient leans: N/A  Psychiatric Specialty Exam: Physical Exam  Nursing note and vitals reviewed. Constitutional: He appears well-developed and well-nourished.  HENT:  Head: Normocephalic and atraumatic.  Eyes: Conjunctivae are normal. Pupils are equal, round, and reactive to light.  Neck: Normal range of motion.  Cardiovascular: Regular rhythm and normal heart sounds.   Respiratory: Effort normal. No respiratory distress.  GI: Soft.  Musculoskeletal: Normal range of motion.  Neurological: He is alert.  Skin: Skin is warm and dry.  Psychiatric: His mood appears anxious. His affect is blunt. His speech is delayed. He is slowed and withdrawn. Thought content is not paranoid. Cognition and memory are impaired. He expresses impulsivity. He exhibits a depressed mood. He expresses no homicidal and no suicidal ideation.    Review of Systems  Constitutional: Positive for malaise/fatigue.  HENT: Negative.   Eyes: Negative.   Respiratory: Negative.   Cardiovascular: Negative.   Gastrointestinal: Positive for heartburn and nausea.  Musculoskeletal: Negative.   Skin: Negative.   Neurological: Positive for weakness.  Psychiatric/Behavioral: Positive for depression, hallucinations, memory loss and substance abuse.  Negative for suicidal ideas. The patient is nervous/anxious and has insomnia.     Blood pressure 111/65, pulse (!) 116, temperature 97.8 F (36.6 C), temperature source Oral, resp. rate 16, weight 77.1 kg (170 lb), SpO2 99 %.Body mass index is 25.1 kg/m.  General Appearance: Disheveled  Eye Contact:  Minimal  Speech:  Slow and Slurred  Volume:  Decreased  Mood:  Dysphoric  Affect:  Congruent  Thought Process:  Disorganized  Orientation:  Full (Time, Place, and Person)  Thought Content:  Rumination and Tangential  Suicidal Thoughts:  No  Homicidal Thoughts:  No  Memory:  Immediate;   Good Recent;   Fair Remote;   Fair  Judgement:  Fair  Insight:  Fair  Psychomotor Activity:  Decreased  Concentration:  Concentration: Fair  Recall:  [employer] of Knowledge:  Fair  Language:  Fair  Akathisia:  No  Handed:  Right  AIMS (if indicated):     Assets:  Desire for Improvement  ADL's:  Impaired  Cognition:  Impaired,  Mild  Sleep:        Treatment Plan Summary: Daily contact with patient to assess and evaluate symptoms and progress in treatment, Medication management and Plan Patient with an alcohol level of 0 but who is having symptoms and signs of active alcohol withdrawal. History of delirium tremens in the past. Case reviewed with emergency room physician. Patient would not be appropriate for admission to the psychiatric ward. I will follow-up and have made sure he has orders for alcohol withdrawal protocol. We will follow-up on the medical service after admission if needed as well.  Disposition: Patient does not meet criteria for psychiatric inpatient admission. Supportive therapy provided about ongoing stressors. Discussed crisis plan, support from social network, calling 911, coming to the Emergency Department, and calling Suicide Hotline.  Jenny Reichmann  [place], MD 01/06/2017 4:15 PM

## 2017-01-06 NOTE — ED Notes (Signed)
Pt given two warm blankets.  

## 2017-01-06 NOTE — H&P (Signed)
[employer] - [place] at [facility]   PATIENT NAME: Eric Manning    MR#:  [id number]  DATE OF BIRTH:  01/13/1971  DATE OF ADMISSION:  01/06/2017  PRIMARY CARE PHYSICIAN: No PCP Per Patient   REQUESTING/REFERRING PHYSICIAN: Rockne Menghini, MD  CHIEF COMPLAINT:   Chief Complaint  Patient presents with  . Alcohol Problem   Tremor, shaking and hallucination. HISTORY OF PRESENT ILLNESS:  Eric Manning  is a 46 y.o. male with a known history of alcoholism, alcohol withdrawal seizure, depressioin and aneurysm.the patient presented to the ED with alcohol withdrawal symptoms and drinking masher remover. He complains  Of tremor, shaking, anxiety and hallucination. The patient reports that he last drank alcohol last night, and this morning drank 8 ounces of no polish remover 8 AM. He denies that he was trying to kill himself, or that he has any homicidal ideations. ED physician reqested admission for alcohol withdrawal.the patient also complains of depression.  PAST MEDICAL HISTORY:   Past Medical History:  Diagnosis Date  . Alcoholism (HCC)   . Aneurysm (HCC)   . Depression   . Seizures (HCC)    last seizure 2006 alcohol withdrawals    PAST SURGICAL HISTORY:   Past Surgical History:  Procedure Laterality Date  . rotator cuff surgery    . SKIN GRAFT      SOCIAL HISTORY:   Social History  Substance Use Topics  . Smoking status: Never Smoker  . Smokeless tobacco: Never Used  . Alcohol use 0.6 oz/week    12 - 18 Cans of beer, 1 Shots of liquor per week     Comment: daily beer 18 cans or more; liquor occasionally, "as much as I can get"    FAMILY HISTORY:   Family History  Problem Relation Age of Onset  . Heart attack Father   . Hypertension Father   . Diabetes Neg Hx     DRUG ALLERGIES:  No Known Allergies  REVIEW OF SYSTEMS:   Review of Systems  Constitutional: Negative for chills, fever and malaise/fatigue.  HENT: Negative for  congestion.   Eyes: Negative for blurred vision and double vision.  Respiratory: Negative for cough, shortness of breath and stridor.   Cardiovascular: Negative for chest pain and leg swelling.  Gastrointestinal: Positive for diarrhea and nausea. Negative for abdominal pain, blood in stool, melena and vomiting.  Genitourinary: Negative for dysuria and hematuria.  Musculoskeletal: Negative for back pain.  Skin: Negative for itching and rash.  Neurological: Positive for dizziness, tremors and headaches. Negative for sensory change, speech change, focal weakness, seizures, loss of consciousness and weakness.  Psychiatric/Behavioral: Positive for depression and hallucinations. Negative for substance abuse and suicidal ideas. The patient is nervous/anxious.     MEDICATIONS AT HOME:   Prior to Admission medications   Medication Sig Start Date End Date Taking? Authorizing Provider  LORazepam (ATIVAN) 1 MG tablet Take 4 the 1st day Take 3 the 2nd day Take 2 the 3rd day Take 1 the 4th day 01/03/17  Yes Srikar Sudini, MD  FLUoxetine (PROZAC) 10 MG capsule Take 10 mg by mouth daily.    Historical Provider, MD  ibuprofen (ADVIL,MOTRIN) 600 MG tablet Take 1 tablet (600 mg total) by mouth every 8 (eight) hours as needed. Patient not taking: Reported on 01/06/2017 12/07/16   Joni Reining, PA-C      VITAL SIGNS:  Blood pressure 111/65, pulse (!) 116, temperature 97.8 F (36.6 C), temperature source Oral, resp. rate 16,  weight 170 lb (77.1 kg), SpO2 99 %.  PHYSICAL EXAMINATION:  Physical Exam  GENERAL:  46 y.o.-year-old patient lying in the bed with no acute distress.  EYES: Pupils equal, round, reactive to light and accommodation. No scleral icterus. Extraocular muscles intact.  HEENT: Head atraumatic, normocephalic. Oropharynx and nasopharynx clear.  NECK:  Supple, no jugular venous distention. No thyroid enlargement, no tenderness.  LUNGS: Normal breath sounds bilaterally, no wheezing,  rales,rhonchi or crepitation. No use of accessory muscles of respiration.  CARDIOVASCULAR: S1, S2 normal. No murmurs, rubs, or gallops.  ABDOMEN: Soft, nontender, nondistended. Bowel sounds present. No organomegaly or mass.  EXTREMITIES: No pedal edema, cyanosis, or clubbing.  NEUROLOGIC: Cranial nerves II through XII are intact. Muscle strength 5/5 in all extremities. Sensation intact. Gait not checked.  PSYCHIATRIC: The patient is alert and oriented x 3.  SKIN: No obvious rash, lesion, or ulcer.   LABORATORY PANEL:   CBC  Recent Labs Lab 01/06/17 1307  WBC 6.6  HGB 13.3  HCT 39.4*  PLT 211   ------------------------------------------------------------------------------------------------------------------  Chemistries   Recent Labs Lab 01/02/17 0441 01/06/17 1307  NA 135 136  K 3.5 3.9  CL 104 101  CO2 24 24  GLUCOSE 100* 97  BUN 14 9  CREATININE 0.92 1.12  CALCIUM 8.1* 9.5  MG 2.2  --   AST 95* 77*  ALT 79* 112*  ALKPHOS 58 86  BILITOT 1.0 0.7   ------------------------------------------------------------------------------------------------------------------  Cardiac Enzymes  Recent Labs Lab 01/01/17 0834  TROPONINI <0.03   ------------------------------------------------------------------------------------------------------------------  RADIOLOGY:  No results found.    IMPRESSION AND PLAN:   Alcohol withdrawal. The patient will be admitted to medical floor. Start CIWA protocol. Fall, aspiration and seizure precaution. Abnormal LFT due to alcohol. f/u LFT. Depression. Psych consult    All the records are reviewed and case discussed with ED provider. Management plans discussed with the patient, family and they are in agreement.  CODE STATUS: full code.  TOTAL TIME TAKING CARE OF THIS PATIENT: 53 minutes.    Shaune Pollack M.D on 01/06/2017 at 4:04 PM  Between 7am to 6pm - Pager - [phone]  After 6pm go to [email or web address] - Chartered loss adjuster [facility]  Office  [phone]  CC: Primary care physician; No PCP Per Patient   Note: This dictation was prepared with Dragon dictation along with smaller phrase technology. Any transcriptional errors that result from this process are unintentional.

## 2017-01-06 NOTE — ED Notes (Signed)
Probation officer called about patient. [place] [phone] Ext. [phone]  Reports patient has plans to go to [facility] on May 17th

## 2017-01-06 NOTE — ED Provider Notes (Signed)
[facility] Emergency Department Provider Note  ____________________________________________  Time seen: Approximately 1:32 PM  I have reviewed the triage vital signs and the nursing notes.   HISTORY  Chief Complaint Alcohol Problem    HPI Eric Manning is a 46 y.o. male w/ a hx of alcohol dependence and ETOH withdrawal seizures, depression, presenting w/ ETOH withdrawal symptoms and drinking nail polish remover. The patient reports that he last drank alcohol last night, and this morning drank 8 ounces of no polish remover 8 AM. He denies that he was trying to kill himself, or that he has any homicidal ideations. He reports visual hallucinations "seeing figures that aren't really there," that occurred this morning. He denies any nausea or vomiting, sleepiness, throat irritation, or other medical complaints at this time.   Past Medical History:  Diagnosis Date  . Alcoholism (HCC)   . Aneurysm (HCC)   . Seizures (HCC)    last seizure 2006 alcohol withdrawals    Patient Active Problem List   Diagnosis Date Noted  . Alcohol withdrawal (HCC) 01/01/2017  . Alcohol dependence (HCC) 07/13/2012  . Depressive disorder 07/13/2012  . Post traumatic stress disorder 07/13/2012    Past Surgical History:  Procedure Laterality Date  . rotator cuff surgery    . SKIN GRAFT      Current Outpatient Rx  . Order #: [id number] Class: Historical Med  . Order #: [id number] Class: Print  . Order #: [id number] Class: Print  . Order #: [id number] Class: Historical Med  . Order #: [id number] Class: Historical Med    Allergies Patient has no known allergies.  Family History  Problem Relation Age of Onset  . Diabetes Neg Hx   . Hypertension Neg Hx     Social History Social History  Substance Use Topics  . Smoking status: Never Smoker  . Smokeless tobacco: Never Used  . Alcohol use 0.6 oz/week    12 - 18 Cans of beer, 1 Shots of liquor per week     Comment: daily beer 18  cans or more; liquor occasionally, "as much as I can get"    Review of Systems Constitutional: No fever/chills. No lightheadedness or syncope. Eyes: No visual changes. ENT: No sore throat. No congestion or rhinorrhea. Cardiovascular: Denies chest pain. Denies palpitations. Respiratory: Denies shortness of breath.  No cough. Gastrointestinal: No abdominal pain.  No nausea, no vomiting.  No diarrhea.  No constipation. Genitourinary: Negative for dysuria. Musculoskeletal: Negative for back pain. Skin: Negative for rash. Neurological: Negative for headaches. No focal numbness, tingling or weakness.  Psychiatric:Positive visual hallucinations. Negative SI, HI or auditory hallucinations.  10-point ROS otherwise negative.  ____________________________________________   PHYSICAL EXAM:  VITAL SIGNS: ED Triage Vitals [01/06/17 1257]  Enc Vitals Group     BP      Pulse      Resp 18     Temp      Temp Source Oral     SpO2      Weight 170 lb (77.1 kg)     Height      Head Circumference      Peak Flow      Pain Score 8     Pain Loc      Pain Edu?      Excl. in GC?     Constitutional: Alert and oriented and answers questions appropriately. He is shaky, tremulous, but GCS is 15. Eyes: Conjunctivae are normal.  EOMI. No scleral icterus. Head: Atraumatic. Nose: No congestion/rhinnorhea. Mouth/Throat: Mucous membranes  are moist.  Neck: No stridor.  Supple.  No meningismus. Cardiovascular: Fast rate, regular rhythm. No murmurs, rubs or gallops. Hypertensive. Respiratory: Normal respiratory effort.  No accessory muscle use or retractions. Lungs CTAB.  No wheezes, rales or ronchi. Gastrointestinal: Soft, nontender and nondistended.  No guarding or rebound.  No peritoneal signs. Musculoskeletal: No LE edema. No ttp in the calves or palpable cords.  Negative Homan's sign. Neurologic:  A&Ox3.  Speech is clear.  Face and smile are symmetric.  EOMI.  Moves all extremities well. Skin:  Skin  is warm, dry and intact. No rash noted. Psychiatric: The patient endorses visual hallucinations and use of a polish remover, but denies SI, HI or auditory hallucinations. At this time, he is calm and cooperative and able to answer questions and follow commands.  ____________________________________________   LABS (all labs ordered are listed, but only abnormal results are displayed)  Labs Reviewed  COMPREHENSIVE METABOLIC PANEL  ETHANOL  SALICYLATE LEVEL  ACETAMINOPHEN LEVEL  CBC  URINE DRUG SCREEN, QUALITATIVE ([facility] ONLY)  CBG MONITORING, ED   ____________________________________________  EKG   Not indicated  ____________________________________________  RADIOLOGY  No results found.  ____________________________________________   PROCEDURES  Procedure(s) performed: None  Procedures  Critical Care performed: No ____________________________________________   INITIAL IMPRESSION / ASSESSMENT AND PLAN / ED COURSE  Pertinent labs & imaging results that were available during my care of the patient were reviewed by me and considered in my medical decision making (see chart for details).  46 y.o. male with a history of alcohol dependence presenting after having ingested 8 ounces of a polish remover rate a.m., last alcohol last night. On arrival to the emergency department, the patient is tremulous, shaky, tachycardic and hypertensive. I am concerned about alcohol withdrawal and have treated him immediately with oral Ativan. I spoken with poison control about his ingestion, and there is no additional observation needed or workup required given the amount that he had and the time it was ingested. We will get laboratory studies and continue to monitor the patient closely. CIWA protocol has been initiated.  ----------------------------------------- 3:36 PM on 01/06/2017 -----------------------------------------  The patient has significantly improved clinically after 4 mg of  Ativan by mouth. He is admitted to the hospitalist for alcohol withdrawal symptoms with high risk of DT's.  ____________________________________________  FINAL CLINICAL IMPRESSION(S) / ED DIAGNOSES  Final diagnoses:  Alcohol withdrawal syndrome with complication (HCC)  Ingestion of substance, undetermined intent, initial encounter         NEW MEDICATIONS STARTED DURING THIS VISIT:  New Prescriptions   No medications on file      Rockne Menghini, MD 01/06/17 1540

## 2017-01-07 LAB — BASIC METABOLIC PANEL
Anion gap: 5 (ref 5–15)
BUN: 17 mg/dL (ref 6–20)
CO2: 27 mmol/L (ref 22–32)
CREATININE: 1.05 mg/dL (ref 0.61–1.24)
Calcium: 8.6 mg/dL — ABNORMAL LOW (ref 8.9–10.3)
Chloride: 102 mmol/L (ref 101–111)
Glucose, Bld: 96 mg/dL (ref 65–99)
POTASSIUM: 3.7 mmol/L (ref 3.5–5.1)
SODIUM: 134 mmol/L — AB (ref 135–145)

## 2017-01-07 LAB — CBC
HEMATOCRIT: 35.5 % — AB (ref 40.0–52.0)
Hemoglobin: 12.2 g/dL — ABNORMAL LOW (ref 13.0–18.0)
MCH: 32.6 pg (ref 26.0–34.0)
MCHC: 34.4 g/dL (ref 32.0–36.0)
MCV: 94.7 fL (ref 80.0–100.0)
PLATELETS: 193 10*3/uL (ref 150–440)
RBC: 3.75 MIL/uL — ABNORMAL LOW (ref 4.40–5.90)
RDW: 14.4 % (ref 11.5–14.5)
WBC: 5.3 10*3/uL (ref 3.8–10.6)

## 2017-01-07 MED ORDER — BUSPIRONE HCL 5 MG PO TABS
10.0000 mg | ORAL_TABLET | Freq: Two times a day (BID) | ORAL | Status: DC
  Administered 2017-01-07 – 2017-01-08 (×2): 10 mg via ORAL
  Filled 2017-01-07 (×2): qty 2

## 2017-01-07 MED ORDER — LORAZEPAM 2 MG/ML IJ SOLN
2.0000 mg | Freq: Once | INTRAMUSCULAR | Status: AC
  Administered 2017-01-07: 02:00:00 2 mg via INTRAVENOUS
  Filled 2017-01-07: qty 1

## 2017-01-07 NOTE — Consult Note (Signed)
[facility] Consult   Reason for Consult:  Consult for 46 year old man with a history of alcohol dependence presents to the emergency room in alcohol withdrawal Referring Physician:  Mariea Clonts Patient Identification: Eric Manning MRN:  [id number] Principal Diagnosis: Alcohol withdrawal [facility]) Diagnosis:   Patient Active Problem List   Diagnosis Date Noted  . Alcohol withdrawal ([place]) [F10.239] 01/01/2017  . Alcohol dependence ([place]) [F10.20] 07/13/2012  . Depressive disorder [F32.9] 07/13/2012  . Post traumatic stress disorder [F43.10] 07/13/2012    Total Time spent with patient: 20 minutes  Subjective:   Eric Manning is a 47 y.o. male patient admitted with "I'm trying to stop".  Follow-up note for 46 year old man with alcohol dependence and withdrawal. Patient seen this afternoon. Today he tells me he is feeling much better. Not feeling shaky. Says he's been able to eat okay. Denies any hallucinations. He still has been getting several doses of Librium and Ativan throughout the day. Vital signs are looking pretty stable. Affect is much more appropriate no sign of delirium.  HPI:  Patient interviewed. Chart reviewed. Patient known from previous encounters. 46 year old man with alcohol dependence. He was discharged from the hospital on April 28. Reports that he immediately started drinking heavily. Last drink was yesterday. This morning he says that he drank a bottle of nail polish remover cause he thought that it would help him to not go into DTs. Patient denies any suicidal ideation or intent. Mood is feeling anxious. Having some visual hallucinations but no evidence of delusions. Cooperative with treatment. Not on any psychiatric medicine.  Social history: Has been living in a space that he borrows from a friend. Not able to work regularly. Does have a girlfriend who has some knowledge of his situation.  Medical history: History of recurrent alcohol withdrawal history of  seizures history of DTs.  Substance abuse history: Long-standing alcohol abuse with DTs and seizures in the past. Has had some sobriety mostly while actively involved in treatment.  Past Psychiatric History: No history of suicide attempts. Has had a past diagnosis of PTSD but not currently getting any treatment for it. Denies any history of psychotic symptoms mania or violence  Risk to Self: Suicidal Ideation: No Suicidal Intent: No Is patient at risk for suicide?: No Suicidal Plan?: No Access to Means: No What has been your use of drugs/alcohol within the last 12 months?: Alcohol How many times?: 0 Other Self Harm Risks: 0 Triggers for Past Attempts: None known Intentional Self Injurious Behavior: None Risk to Others: Homicidal Ideation: No Thoughts of Harm to Others: No Current Homicidal Intent: No Current Homicidal Plan: No Access to Homicidal Means: No Identified Victim: Reports of none History of harm to others?: No Assessment of Violence: None Noted Violent Behavior Description: Reports of none Does patient have access to weapons?: No Criminal Charges Pending?: No Does patient have a court date: No Prior Inpatient Therapy: Prior Inpatient Therapy: Yes Prior Therapy Dates: Unknown Prior Therapy Facilty/Provider(s): "[facility]" Reason for Treatment: Alcohol Use Disorder Prior Outpatient Therapy: Prior Outpatient Therapy: No Prior Therapy Dates: Reports of none Prior Therapy Facilty/Provider(s): Reports of none Reason for Treatment: Reports of none Does patient have an ACCT team?: No Does patient have Intensive In-House Services?  : No Does patient have [facility] services? : No Does patient have P4CC services?: No  Past Medical History:  Past Medical History:  Diagnosis Date  . Alcoholism ([place])   . Aneurysm ([place])   . Depression   . Seizures ([place])  last seizure 2006 alcohol withdrawals    Past Surgical History:  Procedure Laterality Date  . rotator cuff surgery     . SKIN GRAFT     Family History:  Family History  Problem Relation Age of Onset  . Heart attack Father   . Hypertension Father   . Diabetes Neg Hx    Family Psychiatric  History: Positive for alcohol abuse Social History:  History  Alcohol Use  . 0.6 oz/week  . 12 - 18 Cans of beer, 1 Shots of liquor per week    Comment: daily beer 18 cans or more; liquor occasionally, "as much as I can get"     History  Drug Use  . Types: Cocaine    Comment: 1 week ago. Snorted Cocaine.     Social History   Social History  . Marital status: Divorced    Spouse name: N/A  . Number of children: N/A  . Years of education: N/A   Social History Main Topics  . Smoking status: Never Smoker  . Smokeless tobacco: Never Used  . Alcohol use 0.6 oz/week    12 - 18 Cans of beer, 1 Shots of liquor per week     Comment: daily beer 18 cans or more; liquor occasionally, "as much as I can get"  . Drug use: Yes    Types: Cocaine     Comment: 1 week ago. Snorted Cocaine.   . Sexual activity: Yes    Birth control/ protection: None   Other Topics Concern  . None   Social History Narrative  . None   Additional Social History:    Allergies:  No Known Allergies  Labs:  Results for orders placed or performed during the hospital encounter of 01/06/17 (from the past 48 hour(s))  Comprehensive metabolic panel     Status: Abnormal   Collection Time: 01/06/17  1:07 PM  Result Value Ref Range   Sodium 136 135 - 145 mmol/L   Potassium 3.9 3.5 - 5.1 mmol/L   Chloride 101 101 - 111 mmol/L   CO2 24 22 - 32 mmol/L   Glucose, Bld 97 65 - 99 mg/dL   BUN 9 6 - 20 mg/dL   Creatinine, Ser 1.12 0.61 - 1.24 mg/dL   Calcium 9.5 8.9 - 10.3 mg/dL   Total Protein 8.1 6.5 - 8.1 g/dL   Albumin 4.6 3.5 - 5.0 g/dL   AST 77 (H) 15 - 41 U/L   ALT 112 (H) 17 - 63 U/L   Alkaline Phosphatase 86 38 - 126 U/L   Total Bilirubin 0.7 0.3 - 1.2 mg/dL   GFR calc non Af Amer >60 >60 mL/min   GFR calc Af Amer >60 >60 mL/min     Comment: (NOTE) The eGFR has been calculated using the CKD EPI equation. This calculation has not been validated in all clinical situations. eGFR's persistently <60 mL/min signify possible Chronic Kidney Disease.    Anion gap 11 5 - 15  Ethanol     Status: None   Collection Time: 01/06/17  1:07 PM  Result Value Ref Range   Alcohol, Ethyl (B) <5 <5 mg/dL    Comment:        LOWEST DETECTABLE LIMIT FOR SERUM ALCOHOL IS 5 mg/dL FOR MEDICAL PURPOSES ONLY   Salicylate level     Status: None   Collection Time: 01/06/17  1:07 PM  Result Value Ref Range   Salicylate Lvl <2.4 2.8 - 30.0 mg/dL  Acetaminophen level  Status: Abnormal   Collection Time: 01/06/17  1:07 PM  Result Value Ref Range   Acetaminophen (Tylenol), Serum <10 (L) 10 - 30 ug/mL    Comment:        THERAPEUTIC CONCENTRATIONS VARY SIGNIFICANTLY. A RANGE OF 10-30 ug/mL MAY BE AN EFFECTIVE CONCENTRATION FOR MANY PATIENTS. HOWEVER, SOME ARE BEST TREATED AT CONCENTRATIONS OUTSIDE THIS RANGE. ACETAMINOPHEN CONCENTRATIONS >150 ug/mL AT 4 HOURS AFTER INGESTION AND >50 ug/mL AT 12 HOURS AFTER INGESTION ARE OFTEN ASSOCIATED WITH TOXIC REACTIONS.   cbc     Status: Abnormal   Collection Time: 01/06/17  1:07 PM  Result Value Ref Range   WBC 6.6 3.8 - 10.6 K/uL   RBC 4.23 (L) 4.40 - 5.90 MIL/uL   Hemoglobin 13.3 13.0 - 18.0 g/dL   HCT 39.4 (L) 40.0 - 52.0 %   MCV 93.1 80.0 - 100.0 fL   MCH 31.5 26.0 - 34.0 pg   MCHC 33.9 32.0 - 36.0 g/dL   RDW 14.3 11.5 - 14.5 %   Platelets 211 150 - 440 K/uL  Glucose, capillary     Status: None   Collection Time: 01/06/17  1:42 PM  Result Value Ref Range   Glucose-Capillary 89 65 - 99 mg/dL   Comment 1 Notify RN   Urine Drug Screen, Qualitative     Status: Abnormal   Collection Time: 01/06/17 11:30 PM  Result Value Ref Range   Tricyclic, Ur Screen NONE DETECTED NONE DETECTED   Amphetamines, Ur Screen NONE DETECTED NONE DETECTED   MDMA (Ecstasy)Ur Screen NONE DETECTED NONE  DETECTED   Cocaine Metabolite,Ur [place] POSITIVE (A) NONE DETECTED   Opiate, Ur Screen POSITIVE (A) NONE DETECTED   Phencyclidine (PCP) Ur S NONE DETECTED NONE DETECTED   Cannabinoid 50 Ng, Ur [place] NONE DETECTED NONE DETECTED   Barbiturates, Ur Screen NONE DETECTED NONE DETECTED   Benzodiazepine, Ur Scrn POSITIVE (A) NONE DETECTED   Methadone Scn, Ur NONE DETECTED NONE DETECTED    Comment: (NOTE) 419  Tricyclics, urine               Cutoff 1000 ng/mL 200  Amphetamines, urine             Cutoff 1000 ng/mL 300  MDMA (Ecstasy), urine           Cutoff 500 ng/mL 400  Cocaine Metabolite, urine       Cutoff 300 ng/mL 500  Opiate, urine                   Cutoff 300 ng/mL 600  Phencyclidine (PCP), urine      Cutoff 25 ng/mL 700  Cannabinoid, urine              Cutoff 50 ng/mL 800  Barbiturates, urine             Cutoff 200 ng/mL 900  Benzodiazepine, urine           Cutoff 200 ng/mL 1000 Methadone, urine                Cutoff 300 ng/mL 1100 1200 The urine drug screen provides only a preliminary, unconfirmed 1300 analytical test result and should not be used for non-medical 1400 purposes. Clinical consideration and professional judgment should 1500 be applied to any positive drug screen result due to possible 1600 interfering substances. A more specific alternate chemical method 1700 must be used in order to obtain a confirmed analytical result.  1800 Gas chromato graphy / mass spectrometry (  GC/MS) is the preferred 1900 confirmatory method.   Basic metabolic panel     Status: Abnormal   Collection Time: 01/07/17  6:16 AM  Result Value Ref Range   Sodium 134 (L) 135 - 145 mmol/L   Potassium 3.7 3.5 - 5.1 mmol/L   Chloride 102 101 - 111 mmol/L   CO2 27 22 - 32 mmol/L   Glucose, Bld 96 65 - 99 mg/dL   BUN 17 6 - 20 mg/dL   Creatinine, Ser 1.05 0.61 - 1.24 mg/dL   Calcium 8.6 (L) 8.9 - 10.3 mg/dL   GFR calc non Af Amer >60 >60 mL/min   GFR calc Af Amer >60 >60 mL/min    Comment: (NOTE) The  eGFR has been calculated using the CKD EPI equation. This calculation has not been validated in all clinical situations. eGFR's persistently <60 mL/min signify possible Chronic Kidney Disease.    Anion gap 5 5 - 15  CBC     Status: Abnormal   Collection Time: 01/07/17  6:16 AM  Result Value Ref Range   WBC 5.3 3.8 - 10.6 K/uL   RBC 3.75 (L) 4.40 - 5.90 MIL/uL   Hemoglobin 12.2 (L) 13.0 - 18.0 g/dL   HCT 35.5 (L) 40.0 - 52.0 %   MCV 94.7 80.0 - 100.0 fL   MCH 32.6 26.0 - 34.0 pg   MCHC 34.4 32.0 - 36.0 g/dL   RDW 14.4 11.5 - 14.5 %   Platelets 193 150 - 440 K/uL    Current Facility-Administered Medications  Medication Dose Route Frequency Provider Last Rate Last Dose  . 0.9 %  sodium chloride infusion  250 mL Intravenous PRN Demetrios Loll, MD      . acetaminophen (TYLENOL) tablet 650 mg  650 mg Oral Q6H PRN Demetrios Loll, MD       Or  . acetaminophen (TYLENOL) suppository 650 mg  650 mg Rectal Q6H PRN Demetrios Loll, MD      . albuterol (PROVENTIL) (2.5 MG/3ML) 0.083% nebulizer solution 2.5 mg  2.5 mg Nebulization Q2H PRN Demetrios Loll, MD      . busPIRone (BUSPAR) tablet 10 mg  10 mg Oral BID Gonzella Lex, MD      . chlordiazePOXIDE (LIBRIUM) capsule 25 mg  25 mg Oral TID Demetrios Loll, MD   25 mg at 01/07/17 1621  . enoxaparin (LOVENOX) injection 40 mg  40 mg Subcutaneous Q24H Demetrios Loll, MD   40 mg at 01/06/17 2138  . FLUoxetine (PROZAC) capsule 10 mg  10 mg Oral Daily Demetrios Loll, MD   10 mg at 16/10/96 0454  . folic acid (FOLVITE) tablet 1 mg  1 mg Oral Daily Demetrios Loll, MD   1 mg at 01/07/17 1021  . HYDROcodone-acetaminophen (NORCO/VICODIN) 5-325 MG per tablet 1-2 tablet  1-2 tablet Oral Q4H PRN Demetrios Loll, MD   2 tablet at 01/07/17 1808  . ketorolac (TORADOL) 30 MG/ML injection 30 mg  30 mg Intravenous Q6H PRN Demetrios Loll, MD   30 mg at 01/07/17 1621  . LORazepam (ATIVAN) tablet 1 mg  1 mg Oral Q6H PRN Demetrios Loll, MD   1 mg at 01/06/17 2306   Or  . LORazepam (ATIVAN) injection 1 mg  1 mg Intravenous  Q6H PRN Demetrios Loll, MD   1 mg at 01/07/17 1849  . multivitamin with minerals tablet 1 tablet  1 tablet Oral Daily Demetrios Loll, MD   1 tablet at 01/07/17 1021  . ondansetron (ZOFRAN) tablet 4  mg  4 mg Oral Q6H PRN Demetrios Loll, MD       Or  . ondansetron [facility]) injection 4 mg  4 mg Intravenous Q6H PRN Demetrios Loll, MD      . senna-docusate (Senokot-S) tablet 1 tablet  1 tablet Oral QHS PRN Demetrios Loll, MD      . sodium chloride flush (NS) 0.9 % injection 3 mL  3 mL Intravenous Q12H Demetrios Loll, MD   3 mL at 01/07/17 1000  . sodium chloride flush (NS) 0.9 % injection 3 mL  3 mL Intravenous PRN Demetrios Loll, MD      . thiamine (VITAMIN B-1) tablet 100 mg  100 mg Oral Daily Demetrios Loll, MD   100 mg at 01/07/17 1021   Or  . thiamine (B-1) injection 100 mg  100 mg Intravenous Daily Demetrios Loll, MD        Musculoskeletal: Strength & Muscle Tone: decreased Gait & Station: unable to stand Patient leans: N/A  Psychiatric Specialty Exam: Physical Exam  Nursing note and vitals reviewed. Constitutional: He appears well-developed and well-nourished.  HENT:  Head: Normocephalic and atraumatic.  Eyes: Conjunctivae are normal. Pupils are equal, round, and reactive to light.  Neck: Normal range of motion.  Cardiovascular: Regular rhythm and normal heart sounds.   Respiratory: Effort normal. No respiratory distress.  GI: Soft.  Musculoskeletal: Normal range of motion.  Neurological: He is alert.  Skin: Skin is warm and dry.  Psychiatric: His mood appears anxious. His affect is not blunt. His speech is delayed. He is slowed. He is not withdrawn. Thought content is not paranoid. Cognition and memory are impaired. He does not express impulsivity. He does not exhibit a depressed mood. He expresses no homicidal and no suicidal ideation.    Review of Systems  Constitutional: Positive for malaise/fatigue.  HENT: Negative.   Eyes: Negative.   Respiratory: Negative.   Cardiovascular: Negative.   Gastrointestinal: Positive  for heartburn and nausea.  Musculoskeletal: Negative.   Skin: Negative.   Neurological: Positive for weakness.  Psychiatric/Behavioral: Positive for substance abuse. Negative for depression, hallucinations, memory loss and suicidal ideas. The patient is not nervous/anxious and does not have insomnia.     Blood pressure 127/77, pulse 96, temperature 98 F (36.7 C), temperature source Oral, resp. rate 14, height _0  (1.753 m), weight 77.6 kg (171 lb), SpO2 100 %.Body mass index is 25.25 kg/m.  General Appearance: Disheveled  Eye Contact:  Minimal  Speech:  Clear and Coherent  Volume:  Decreased  Mood:  Euthymic  Affect:  Congruent  Thought Process:  Coherent  Orientation:  Full (Time, Place, and Person)  Thought Content:  Logical  Suicidal Thoughts:  No  Homicidal Thoughts:  No  Memory:  Immediate;   Good Recent;   Fair Remote;   Fair  Judgement:  Fair  Insight:  Fair  Psychomotor Activity:  Decreased  Concentration:  Concentration: Fair  Recall:  [employer] of Knowledge:  Fair  Language:  Fair  Akathisia:  No  Handed:  Right  AIMS (if indicated):     Assets:  Desire for Improvement  ADL's:  Impaired  Cognition:  Impaired,  Mild  Sleep:        Treatment Plan Summary: Daily contact with patient to assess and evaluate symptoms and progress in treatment, Medication management and Plan Patient with an alcohol level of 0 but who is having symptoms and signs of active alcohol withdrawal. History of delirium tremens in the past.  Case reviewed with emergency room physician. Patient would not be appropriate for admission to the psychiatric ward. I will follow-up and have made sure he has orders for alcohol withdrawal protocol. We will follow-up on the medical service after admission if needed as well.  Disposition: No evidence of imminent risk to self or others at present.   Patient does not meet criteria for psychiatric inpatient admission. Supportive therapy provided about  ongoing stressors.  Alethia Berthold, MD 01/07/2017 8:21 PM

## 2017-01-07 NOTE — Care Management (Signed)
Patient admitted for alcohol withdrawal. Patient lives at home with fiance  Patient is self pay and unemployed.  Patient does not have PCP. Patient provided application to Medication Management and [facility].  Patient states "whenever I run out of my ativan and pain medicine, I turn to drinking".  Patient states that he has a bed at a 90 day program at [facility] in [place] to start May 17th.  Patient states "I am willing to do whatever I need to do in the mean time to stay clean" CSW notified.  RNCM following for discharge needs

## 2017-01-07 NOTE — Clinical Social Work Note (Signed)
CSW consulted for Current Substance Abuse (drinking nail polish remover at 8 am, approx 8 ounces.). CSW was contacted by pt's fiance, who requested information about applying for disability. CSW provided requested information, as well as applying for Medicaid. CSW spoke with [facility], who will follow up with Med Management information. Per RN, pt is on CIWA, however is oriented. Full Assessment to follow. CSW will continue to follow.   Dede Query, MSW, LCSW  Clinical Social Worker  [phone]

## 2017-01-07 NOTE — Clinical Social Work Note (Signed)
Clinical Social Work Assessment  Patient Details  Name: Eric Manning MRN: [id number] Date of Birth: Jan 14, 1971  Date of referral:  01/07/17               Reason for consult:  Substance Use/ETOH Abuse                Permission sought to share information with:  Family Supports Permission granted to share information::  Yes, Verbal Permission Granted  Name::     Kincer,Vicki   Relationship::  Significant other    Contact Information:  [phone]   Housing/Transportation Living arrangements for the past 2 months:  [place] of Information:  Patient Patient Interpreter Needed:  None Criminal Activity/Legal Involvement Pertinent to Current Situation/Hospitalization:  No - Comment as needed Significant Relationships:  Significant Other Lives with:  Significant Other Do you feel safe going back to the place where you live?  Yes Need for family participation in patient care:  No (Coment)  Care giving concerns:  No care giving concerns identified.   Social Worker assessment / plan:  CSW met with pt to address consult for New SNF. CSW introduce herself and explained role of social work. CSW also explained the nature of the consult. Pt endorsed drinking due as a way to manage when he is out of his medication. Pt reported that he was sober for 10 years in the past. Pt was recently released from incarceration, where he was presctribed medication, however did not have a way to refill them. Pt endorsed drinking nail polish remover. Pt also stated that he will going to a 66 day program on May 17th in [place], [place]. CSW also provided information for local resources to help bridge the gap between admission and entrance to the program. Pt feels that his signifiacant other, Vickie, is supportive and is in agreement that she will help him with his disability application. CSW will continue to follow during this admission as needed.   Employment status:  Unemployed Forensic scientist:   Self Pay (Medicaid Pending) PT Recommendations:  Not assessed at this time Information / Referral to community resources:  Residential Substance Abuse Treatment Options, Outpatient Substance Abuse Treatment Options  Patient/Family's Response to care:  Pt was appreciative of CSW support.   Patient/Family's Understanding of and Emotional Response to Diagnosis, Current Treatment, and Prognosis:  Pt understands that he would benefit from inpatient treatment.   Emotional Assessment Appearance:  Appears stated age Attitude/Demeanor/Rapport:   (Appropriate) Affect (typically observed):  Accepting, Adaptable, Pleasant Orientation:  Oriented to Situation, Oriented to  Time, Oriented to Place, Oriented to Self Alcohol / Substance use:  Alcohol Use Psych involvement (Current and /or in the community):  Yes (Comment)  Discharge Needs  Concerns to be addressed:  Substance Abuse Concerns Readmission within the last 30 days:  Yes Current discharge risk:  Substance Abuse Barriers to Discharge:  Active Substance Use   Darden Dates, LCSW 01/07/2017, 4:33 PM

## 2017-01-07 NOTE — Progress Notes (Signed)
[facility] - [place] at [facility]   PATIENT NAME: Eric Manning    MR#:  [id number]  DATE OF BIRTH:  1970-12-07  SUBJECTIVE:  CHIEF COMPLAINT:   Chief Complaint  Patient presents with  . Alcohol Problem  readmitted for same, says ativan (10 tabs) didn't last and he went back on drinking REVIEW OF SYSTEMS:    Review of Systems  Constitutional: Positive for malaise/fatigue. Negative for chills and fever.  HENT: Negative for sore throat.   Eyes: Negative for blurred vision, double vision and pain.  Respiratory: Negative for cough, hemoptysis, shortness of breath and wheezing.   Cardiovascular: Negative for chest pain, palpitations, orthopnea and leg swelling.  Gastrointestinal: Positive for nausea. Negative for abdominal pain, constipation, diarrhea, heartburn and vomiting.  Genitourinary: Negative for dysuria and hematuria.  Musculoskeletal: Negative for back pain and joint pain.  Skin: Negative for rash.  Neurological: Positive for tremors and weakness. Negative for sensory change, speech change, focal weakness and headaches.  Endo/Heme/Allergies: Does not bruise/bleed easily.  Psychiatric/Behavioral: Negative for depression. The patient is not nervous/anxious.     DRUG ALLERGIES:  No Known Allergies  VITALS:  Blood pressure 118/78, pulse 93, temperature 98.1 F (36.7 C), temperature source Oral, resp. rate 18, height  (1.753 m), weight 77.6 kg (171 lb), SpO2 99 %.  PHYSICAL EXAMINATION:   Physical Exam  GENERAL:  46 y.o.-year-old patient lying in the bed. Tremulous. sweating EYES: Pupils equal, round, reactive to light and accommodation. No scleral icterus. Extraocular muscles intact.  HEENT: Head atraumatic, normocephalic. Oropharynx and nasopharynx clear.  NECK:  Supple, no jugular venous distention. No thyroid enlargement, no tenderness.  LUNGS: Normal breath sounds bilaterally, no wheezing, rales, rhonchi. No use of accessory muscles of  respiration.  CARDIOVASCULAR: S1, S2 normal. No murmurs, rubs, or gallops.  ABDOMEN: Soft, nontender, nondistended. Bowel sounds present. No organomegaly or mass.  EXTREMITIES: No cyanosis, clubbing or edema b/l.   Bilateral tremors NEUROLOGIC: Cranial nerves II through XII are intact. No focal Motor or sensory deficits b/l.   PSYCHIATRIC: The patient is alert and oriented x 3.  SKIN: No obvious rash, lesion, or ulcer.  LABORATORY PANEL:   CBC  Recent Labs Lab 01/07/17 0616  WBC 5.3  HGB 12.2*  HCT 35.5*  PLT 193   ------------------------------------------------------------------------------------------------------------------ Chemistries   Recent Labs Lab 01/02/17 0441 01/06/17 1307 01/07/17 0616  NA 135 136 134*  K 3.5 3.9 3.7  CL 104 101 102  CO2 GLUCOSE 100* 97 96  BUN CREATININE 0.92 1.12 1.05  CALCIUM 8.1* 9.5 8.6*  MG 2.2  --   --   AST 95* 77*  --   ALT 79* 112*  --   ALKPHOS 58 86  --   BILITOT 1.0 0.7  --    ------------------------------------------------------------------------------------------------------------------  Cardiac Enzymes  Recent Labs Lab 01/01/17 0834  TROPONINI <0.03   ------------------------------------------------------------------------------------------------------------------  RADIOLOGY:  No results found.   ASSESSMENT AND PLAN:  46 year old male with past medical history of alcohol abuse, alcohol withdrawal seizures who presents to the hospital due to tremors nausea and noted to be in alcohol withdrawal.  * Alcohol withdrawal. - CIWA protocol, Fall, aspiration and seizure precaution. Appreciate psychiatry input. Will need outpatient resources to help quit alcohol. 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  * Depression. Psych consult appreciated - continue prozac    All the records are reviewed and case discussed with Care Management/Social Workerr. Management  plans discussed with  the patient, family and they are in agreement.  CODE STATUS: FULL CODE  DVT Prophylaxis: SCDs  TOTAL TIME TAKING CARE OF THIS PATIENT: 35 minutes.   POSSIBLE D/C IN 1-2 DAYS, DEPENDING ON CLINICAL CONDITION.  Delfino Lovett M.D on 01/07/2017 at 5:54 PM  Between 7am to 6pm - Pager - [phone]  After 6pm go to [email or web address] - password [email or web address] [facility]  [facility] [place] [facility]  Office  [phone]  CC: Primary care physician; No PCP Per Patient  Note: This dictation was prepared with Dragon dictation along with smaller phrase technology. Any transcriptional errors that result from this process are unintentional.

## 2017-01-08 MED ORDER — ADULT MULTIVITAMIN W/MINERALS CH: 1.0000 | ORAL_TABLET | Freq: Every day | ORAL | Status: AC

## 2017-01-08 MED ORDER — THIAMINE HCL 100 MG PO TABS: 100.0000 mg | ORAL_TABLET | Freq: Every day | ORAL | Status: AC

## 2017-01-08 MED ORDER — BUSPIRONE HCL 10 MG PO TABS: 10.0000 mg | ORAL_TABLET | Freq: Two times a day (BID) | ORAL | 0 refills | Status: AC

## 2017-01-08 MED ORDER — FLUOXETINE HCL 10 MG PO CAPS: 10.0000 mg | ORAL_CAPSULE | Freq: Every day | ORAL | 0 refills | Status: AC

## 2017-01-08 MED ORDER — FOLIC ACID 1 MG PO TABS: 1.0000 mg | ORAL_TABLET | Freq: Every day | ORAL | 0 refills | Status: AC

## 2017-01-08 NOTE — Clinical Social Work Note (Signed)
CSW provided additional information for local resources for behavioral health and substance abuse follow up. Pt was appreciative of CSW information. CSW is signing off as no further needs identified.  Dede QuerySarah Tayron Hunnell, MSW, LCSW  Clinical Social Worker  [phone]

## 2017-01-08 NOTE — Discharge Summary (Signed)
[facility] - [place] at [facility]   PATIENT NAME: Eric Manning    MR#:  [id number]  DATE OF BIRTH:  08-08-1971  DATE OF ADMISSION:  01/01/2017 ADMITTING PHYSICIAN: Houston SirenVivek J Sainani, MD  DATE OF DISCHARGE: 01/03/2017 12:42 PM  PRIMARY CARE PHYSICIAN: No PCP Per Patient   ADMISSION DIAGNOSIS:  Chest pain [R07.9] Alcohol withdrawal syndrome with complication (HCC) [F10.239]  DISCHARGE DIAGNOSIS:  Principal Problem:   Alcohol withdrawal (HCC) Active Problems:   Alcohol dependence (HCC)   SECONDARY DIAGNOSIS:   Past Medical History:  Diagnosis Date  . Alcoholism (HCC)   . Aneurysm (HCC)   . Depression   . Seizures (HCC)    last seizure 2006 alcohol withdrawals     ADMITTING HISTORY  HISTORY OF PRESENT ILLNESS:  Eric RowanRobert Manning  is a 46 y.o. male with a known history of Alcohol abuse, history of alcohol withdrawal seizures who presents to the hospital due to nausea vomiting and tremors and needing help with alcohol detox. Patient says that he has been drinking heavily for the past 2 weeks has last drink being 24 hours ago. Patient says usually drinks liquor. He admits to vomiting which is consistent with alcohol he drinks but nonbloody. He denies any abdominal pain, diarrhea, fever, chills, chest pain, shortness of breath, headache, dizziness or any other associated symptoms. Patient was noted to be tachycardic and going through alcohol withdrawal and therefore hospitalist services were contacted further treatment and evaluation.   HOSPITAL COURSE:   * Alcohol withdrawal Treated with CIWA protocol, IVF, Librium, PRN ativan Improved well and discharged home on ativan Patient is high risk for readmission as he continues to drink  His main concern at discharge were pain meds and not alcohol rehab.  Stable for discharge home. No further withdrawals  CONSULTS OBTAINED:  Treatment Team:  Audery AmelJohn T Clapacs, MD  DRUG ALLERGIES:  No Known Allergies  DISCHARGE  MEDICATIONS:   Discharge Medication List as of 01/03/2017 11:05 AM    CONTINUE these medications which have CHANGED   Details  LORazepam (ATIVAN) 1 MG tablet Take 4 the 1st day Take 3 the 2nd day Take 2 the 3rd day Take 1 the 4th day, Print      CONTINUE these medications which have NOT CHANGED   Details  FLUoxetine (PROZAC) 10 MG capsule Take 10 mg by mouth daily., Historical Med    ibuprofen (ADVIL,MOTRIN) 600 MG tablet Take 1 tablet (600 mg total) by mouth every 8 (eight) hours as needed., Starting Sun 12/07/2016, Print    Multiple Vitamins-Minerals (ADULT GUMMY) CHEW Chew 1 each by mouth daily., Until Discontinued, Historical Med    ranitidine (ZANTAC) 150 MG tablet Take 150 mg by mouth 2 (two) times daily., Until Discontinued, Historical Med      STOP taking these medications     amoxicillin (AMOXIL) 500 MG capsule      oxyCODONE-acetaminophen (ROXICET) 5-325 MG tablet         Today   VITAL SIGNS:  Blood pressure 124/77, pulse 73, temperature 98.1 F (36.7 C), temperature source Oral, resp. rate 18, height 5\' 9"  (1.753 m), weight 77.1 kg (170 lb), SpO2 98 %.  I/O:  No intake or output data in the 24 hours ending 01/08/17 1611  PHYSICAL EXAMINATION:  Physical Exam  GENERAL:  46 y.o.-year-old patient lying in the bed with no acute distress.  LUNGS: Normal breath sounds bilaterally, no wheezing, rales,rhonchi or crepitation. No use of accessory muscles of respiration.  CARDIOVASCULAR: S1,  S2 normal. No murmurs, rubs, or gallops.  ABDOMEN: Soft, non-tender, non-distended. Bowel sounds present. No organomegaly or mass.  NEUROLOGIC: Moves all 4 extremities. PSYCHIATRIC: The patient is alert and oriented x 3.  SKIN: No obvious rash, lesion, or ulcer.   DATA REVIEW:   CBC  Recent Labs Lab 01/07/17 0616  WBC 5.3  HGB 12.2*  HCT 35.5*  PLT 193    Chemistries   Recent Labs Lab 01/02/17 0441 01/06/17 1307 01/07/17 0616  NA 135 136 134*  K 3.5 3.9 3.7   CL 104 101 102  CO2 24 24 27   GLUCOSE 100* 97 96  BUN 14 9 17   CREATININE 0.92 1.12 1.05  CALCIUM 8.1* 9.5 8.6*  MG 2.2  --   --   AST 95* 77*  --   ALT 79* 112*  --   ALKPHOS 58 86  --   BILITOT 1.0 0.7  --     Cardiac Enzymes No results for input(s): TROPONINI in the last 168 hours.  Microbiology Results  Results for orders placed or performed during the hospital encounter of 07/12/12  Rapid strep screen     Status: None   Collection Time: 07/12/12 11:17 PM  Result Value Ref Range Status   Streptococcus, Group A Screen (Direct) NEGATIVE NEGATIVE Final    Comment:        DUE TO INADEQUATE SENSITIVITY OF EIA RAPID TESTS FOR GROUP A STREP (GAS) IT IS RECOMMENDED THAT ALL NEGATIVE RESULTS BE FOLLOWED BY A GROUP A STREP PROBE.    RADIOLOGY:  No results found.  Follow up with PCP in 1 week.  Management plans discussed with the patient, family and they are in agreement.  CODE STATUS:  Code Status History    Date Active Date Inactive Code Status Order ID Comments User Context   01/01/2017 12:22 PM 01/03/2017  3:42 PM Full Code [id number]  Houston Siren, MD Inpatient   01/01/2017  8:36 AM 01/01/2017 12:22 PM Full Code [id number]  Myrna Blazer, MD ED   07/12/2012 10:58 PM 07/13/2012  1:20 PM Full Code [id number]  Otilio Miu, PA ED      TOTAL TIME TAKING CARE OF THIS PATIENT ON DAY OF DISCHARGE: more than 30 minutes.   Milagros Loll R M.D on 01/08/2017 at 4:11 PM  Between 7am to 6pm - Pager - [phone]  After 6pm go to [email or web address] - password [email or web address] [facility]  [facility] [place] [facility]  Office  [phone]  CC: Primary care physician; No PCP Per Patient  Note: This dictation was prepared with Dragon dictation along with smaller phrase technology. Any transcriptional errors that result from this process are unintentional.

## 2017-01-08 NOTE — Discharge Instructions (Signed)
Follow-up with new primary care physician or [facility] on May 8 at 9:30 AM Follow-up with [facility]  program on May 17 as scheduled

## 2017-01-08 NOTE — Discharge Summary (Signed)
[facility] - [place] at [facility]   PATIENT NAME: Eric Manning    MR#:  [id number]  DATE OF BIRTH:  20-Mar-1971  DATE OF ADMISSION:  01/06/2017 ADMITTING PHYSICIAN: Shaune Pollack, MD  DATE OF DISCHARGE: 01/08/17  PRIMARY CARE PHYSICIAN: No PCP Per Patient    ADMISSION DIAGNOSIS:  Alcohol withdrawal syndrome with complication (HCC) [F10.239] Ingestion of substance, undetermined intent, initial encounter [T65.94XA]  DISCHARGE DIAGNOSIS:  Principal Problem:   Alcohol withdrawal (HCC) Active Problems:   Alcohol dependence (HCC)   SECONDARY DIAGNOSIS:   Past Medical History:  Diagnosis Date  . Alcoholism (HCC)   . Aneurysm (HCC)   . Depression   . Seizures (HCC)    last seizure 2006 alcohol withdrawals    HOSPITAL COURSE:   HPI Eric Manning  is a 46 y.o. male with a known history of alcoholism, alcohol withdrawal seizure, depressioin and aneurysm.the patient presented to the ED with alcohol withdrawal symptoms and drinking masher remover. He complains  Of tremor, shaking, anxiety and hallucination. The patient reports that he last drank alcohol last night, and this morning drank 8 ounces of no polish remover 8 AM. He denies that he was trying to kill himself, or that he has any homicidal ideations. ED physician reqested admission for alcohol withdrawal.the patient also complains of depression.  * Alcohol withdrawal. - Clinically improved with CIWA protocol, Fall, aspiration and seizure precaution. Appreciate psychiatry input. Recommending to give only Prozac and BuSpar prescriptions and outpatient follow-up with the AA program. Patient has an appointment with [facility] program on May 17 . Will give prescriptions Prozac and BuSpar as recommended by psychiatry.   * Mild alcoholic hepatitis-clinically doing fine. Patient should stop drinking alcohol. Outpatient follow-up with alcohol anonymous  * Depression. Psych consult appreciated - continue  prozac  DISCHARGE CONDITIONS:   stable  CONSULTS OBTAINED:  Treatment Team:  Audery Amel, MD   PROCEDURES  None   DRUG ALLERGIES:  No Known Allergies  DISCHARGE MEDICATIONS:   Current Discharge Medication List    START taking these medications   Details  busPIRone (BUSPAR) 10 MG tablet Take 1 tablet (10 mg total) by mouth 2 (two) times daily. Qty: 30 tablet, Refills: 0    folic acid (FOLVITE) 1 MG tablet Take 1 tablet (1 mg total) by mouth daily. Qty: 30 tablet, Refills: 0    Multiple Vitamin (MULTIVITAMIN WITH MINERALS) TABS tablet Take 1 tablet by mouth daily.    thiamine 100 MG tablet Take 1 tablet (100 mg total) by mouth daily.      CONTINUE these medications which have CHANGED   Details  FLUoxetine (PROZAC) 10 MG capsule Take 1 capsule (10 mg total) by mouth daily. Qty: 30 capsule, Refills: 0      CONTINUE these medications which have NOT CHANGED   Details  LORazepam (ATIVAN) 1 MG tablet Take 4 the 1st day Take 3 the 2nd day Take 2 the 3rd day Take 1 the 4th day Qty: 10 tablet, Refills: 0    ibuprofen (ADVIL,MOTRIN) 600 MG tablet Take 1 tablet (600 mg total) by mouth every 8 (eight) hours as needed. Qty: 15 tablet, Refills: 0         DISCHARGE INSTRUCTIONS:  Follow-up with new primary care physician or [facility] on May 8 at 9:30 AM Follow-up with [facility]  program on May 17 as scheduled   DIET:  Low fat, Low cholesterol diet  DISCHARGE CONDITION:  Stable  ACTIVITY:  Activity  as tolerated  OXYGEN:  Home Oxygen: No.   Oxygen Delivery: room air  DISCHARGE LOCATION:  home   If you experience worsening of your admission symptoms, develop shortness of breath, life threatening emergency, suicidal or homicidal thoughts you must seek medical attention immediately by calling 911 or calling your MD immediately  if symptoms less severe.  You Must read complete instructions/literature along with all the possible adverse  reactions/side effects for all the Medicines you take and that have been prescribed to you. Take any new Medicines after you have completely understood and accpet all the possible adverse reactions/side effects.   Please note  You were cared for by a hospitalist during your hospital stay. If you have any questions about your discharge medications or the care you received while you were in the hospital after you are discharged, you can call the unit and asked to speak with the hospitalist on call if the hospitalist that took care of you is not available. Once you are discharged, your primary care physician will handle any further medical issues. Please note that NO REFILLS for any discharge medications will be authorized once you are discharged, as it is imperative that you return to your primary care physician (or establish a relationship with a primary care physician if you do not have one) for your aftercare needs so that they can reassess your need for medications and monitor your lab values.     Today  Chief Complaint  Patient presents with  . Alcohol Problem   Patient is doing fine. No tremors. Answering all questions appropriately. Wants to go home. Dr. Toni Amendclapacs has recommended to discharge patient with Prozac and BuSpar but no Ativan or narcotics  ROS:  CONSTITUTIONAL: Denies fevers, chills. Denies any fatigue, weakness.  EYES: Denies blurry vision, double vision, eye pain. EARS, NOSE, THROAT: Denies tinnitus, ear pain, hearing loss. RESPIRATORY: Denies cough, wheeze, shortness of breath.  CARDIOVASCULAR: Denies chest pain, palpitations, edema.  GASTROINTESTINAL: Denies nausea, vomiting, diarrhea, abdominal pain. Denies bright red blood per rectum. GENITOURINARY: Denies dysuria, hematuria. ENDOCRINE: Denies nocturia or thyroid problems. HEMATOLOGIC AND LYMPHATIC: Denies easy bruising or bleeding. SKIN: Denies rash or lesion. MUSCULOSKELETAL: Denies pain in neck, back, shoulder,  knees, hips or arthritic symptoms.  NEUROLOGIC: Denies paralysis, paresthesias.  PSYCHIATRIC: Denies anxiety or depressive symptoms.   VITAL SIGNS:  Blood pressure 125/72, pulse 86, temperature 98 F (36.7 C), temperature source Oral, resp. rate 14, height 5\' 9"  (1.753 m), weight 77.6 kg (171 lb), SpO2 97 %.  I/O:    Intake/Output Summary (Last 24 hours) at 01/08/17 1153 Last data filed at 01/07/17 1800  Gross per 24 hour  Intake              480 ml  Output                0 ml  Net              480 ml    PHYSICAL EXAMINATION:  GENERAL:  46 y.o.-year-old patient lying in the bed with no acute distress.  EYES: Pupils equal, round, reactive to light and accommodation. No scleral icterus. Extraocular muscles intact.  HEENT: Head atraumatic, normocephalic. Oropharynx and nasopharynx clear.  NECK:  Supple, no jugular venous distention. No thyroid enlargement, no tenderness.  LUNGS: Normal breath sounds bilaterally, no wheezing, rales,rhonchi or crepitation. No use of accessory muscles of respiration.  CARDIOVASCULAR: S1, S2 normal. No murmurs, rubs, or gallops.  ABDOMEN: Soft, non-tender, non-distended. Bowel sounds  present. No organomegaly or mass.  EXTREMITIES: No pedal edema, cyanosis, or clubbing.  NEUROLOGIC: Cranial nerves II through XII are intact. Muscle strength 5/5 in all extremities. Sensation intact. Gait not checked.  PSYCHIATRIC: The patient is alert and oriented x 3.  SKIN: No obvious rash, lesion, or ulcer.   DATA REVIEW:   CBC  Recent Labs Lab 01/07/17 0616  WBC 5.3  HGB 12.2*  HCT 35.5*  PLT 193    Chemistries   Recent Labs Lab 01/02/17 0441 01/06/17 1307 01/07/17 0616  NA 135 136 134*  K 3.5 3.9 3.7  CL 104 101 102  CO2 24 24 27   GLUCOSE 100* 97 96  BUN 14 9 17   CREATININE 0.92 1.12 1.05  CALCIUM 8.1* 9.5 8.6*  MG 2.2  --   --   AST 95* 77*  --   ALT 79* 112*  --   ALKPHOS 58 86  --   BILITOT 1.0 0.7  --     Cardiac Enzymes No results  for input(s): TROPONINI in the last 168 hours.  Microbiology Results  Results for orders placed or performed during the hospital encounter of 07/12/12  Rapid strep screen     Status: None   Collection Time: 07/12/12 11:17 PM  Result Value Ref Range Status   Streptococcus, Group A Screen (Direct) NEGATIVE NEGATIVE Final    Comment:        DUE TO INADEQUATE SENSITIVITY OF EIA RAPID TESTS FOR GROUP A STREP (GAS) IT IS RECOMMENDED THAT ALL NEGATIVE RESULTS BE FOLLOWED BY A GROUP A STREP PROBE.    RADIOLOGY:  No results found.  EKG:   Orders placed or performed during the hospital encounter of 01/01/17  . EKG 12-Lead  . EKG 12-Lead      Management plans discussed with the patient, family and they are in agreement.  CODE STATUS:     Code Status Orders        Start     Ordered   01/06/17 1722  Full code  Continuous     01/06/17 1721    Code Status History    Date Active Date Inactive Code Status Order ID Comments User Context   01/01/2017 12:22 PM 01/03/2017  3:42 PM Full Code [id number]  Houston Siren, MD Inpatient   01/01/2017  8:36 AM 01/01/2017 12:22 PM Full Code [id number]  Myrna Blazer, MD ED   07/12/2012 10:58 PM 07/13/2012  1:20 PM Full Code [id number]  Otilio Miu, PA ED      TOTAL TIME TAKING CARE OF THIS PATIENT: 43  minutes.   Note: This dictation was prepared with Dragon dictation along with smaller phrase technology. Any transcriptional errors that result from this process are unintentional.   @MEC @  on 01/08/2017 at 11:53 AM  Between 7am to 6pm - Pager - [phone]  After 6pm go to [email or web address] - password [email or web address] [facility]  [facility] [place] [facility]  Office  [phone]  CC: Primary care physician; No PCP Per Patient

## 2017-01-08 NOTE — Care Management (Signed)
Patient to discharge on Buspar and Prozac.  [facility]  RNCM signing off

## 2017-06-08 ENCOUNTER — Emergency Department: Payer: Self-pay

## 2017-06-08 ENCOUNTER — Encounter: Payer: Self-pay | Admitting: Emergency Medicine

## 2017-06-08 ENCOUNTER — Emergency Department
Admission: EM | Admit: 2017-06-08 | Discharge: 2017-06-08 | Discharge status: Against Medical Advice | Payer: Self-pay | Attending: Student in an Organized Health Care Education/Training Program | Admitting: Student in an Organized Health Care Education/Training Program

## 2017-06-08 DIAGNOSIS — Z79899 Other long term (current) drug therapy: Secondary | ICD-10-CM | POA: Insufficient documentation

## 2017-06-08 DIAGNOSIS — F10120 Alcohol abuse with intoxication, uncomplicated: Secondary | ICD-10-CM | POA: Insufficient documentation

## 2017-06-08 DIAGNOSIS — F10920 Alcohol use, unspecified with intoxication, uncomplicated: Secondary | ICD-10-CM

## 2017-06-08 LAB — URINE DRUG SCREEN, QUALITATIVE (ARMC ONLY)
Amphetamines, Ur Screen: NOT DETECTED
BARBITURATES, UR SCREEN: NOT DETECTED
Benzodiazepine, Ur Scrn: NOT DETECTED
COCAINE METABOLITE, UR ~~LOC~~: POSITIVE — AB
Cannabinoid 50 Ng, Ur ~~LOC~~: NOT DETECTED
MDMA (Ecstasy)Ur Screen: NOT DETECTED
METHADONE SCREEN, URINE: NOT DETECTED
Opiate, Ur Screen: NOT DETECTED
Phencyclidine (PCP) Ur S: NOT DETECTED
TRICYCLIC, UR SCREEN: NOT DETECTED

## 2017-06-08 LAB — BASIC METABOLIC PANEL
ANION GAP: 12 (ref 5–15)
BUN: 8 mg/dL (ref 6–20)
CHLORIDE: 105 mmol/L (ref 101–111)
CO2: 24 mmol/L (ref 22–32)
CREATININE: 0.9 mg/dL (ref 0.61–1.24)
Calcium: 9 mg/dL (ref 8.9–10.3)
GFR calc Af Amer: >60 mL/min (ref >60)
GFR calc non Af Amer: >60 mL/min (ref >60)
GLUCOSE: 102 mg/dL — AB (ref 65–99)
Potassium: 3.7 mmol/L (ref 3.5–5.1)
Sodium: 141 mmol/L (ref 135–145)

## 2017-06-08 LAB — URINALYSIS, COMPLETE (UACMP) WITH MICROSCOPIC
BACTERIA UA: NONE SEEN
BILIRUBIN URINE: NEGATIVE
Glucose, UA: NEGATIVE mg/dL
Hgb urine dipstick: NEGATIVE
Ketones, ur: NEGATIVE mg/dL
Leukocytes, UA: NEGATIVE
NITRITE: NEGATIVE
Protein, ur: NEGATIVE mg/dL
RBC / HPF: NONE SEEN RBC/hpf (ref 0–5)
SPECIFIC GRAVITY, URINE: 1.004 — AB (ref 1.005–1.030)
SQUAMOUS EPITHELIAL / LPF: NONE SEEN
WBC UA: NONE SEEN WBC/hpf (ref 0–5)
pH: 6 (ref 5.0–8.0)

## 2017-06-08 LAB — CBC WITH DIFFERENTIAL/PLATELET
BASOS ABS: 0.1 10*3/uL (ref 0–0.1)
Basophils Relative: 1 %
EOS PCT: 2 %
Eosinophils Absolute: 0.1 10*3/uL (ref 0–0.7)
HCT: 44.5 % (ref 40.0–52.0)
Hemoglobin: 15.1 g/dL (ref 13.0–18.0)
LYMPHS PCT: 34 %
Lymphs Abs: 1.9 10*3/uL (ref 1.0–3.6)
MCH: 31.9 pg (ref 26.0–34.0)
MCHC: 34 g/dL (ref 32.0–36.0)
MCV: 93.8 fL (ref 80.0–100.0)
MONO ABS: 0.3 10*3/uL (ref 0.2–1.0)
MONOS PCT: 6 %
Neutro Abs: 3.2 10*3/uL (ref 1.4–6.5)
Neutrophils Relative %: 57 %
PLATELETS: 257 10*3/uL (ref 150–440)
RBC: 4.75 MIL/uL (ref 4.40–5.90)
RDW: 14.2 % (ref 11.5–14.5)
WBC: 5.6 10*3/uL (ref 3.8–10.6)

## 2017-06-08 LAB — ACETAMINOPHEN LEVEL

## 2017-06-08 LAB — ETHANOL: Alcohol, Ethyl (B): 352 mg/dL (ref <10)

## 2017-06-08 LAB — SALICYLATE LEVEL: SALICYLATE LVL: 10 mg/dL (ref 2.8–30.0)

## 2017-06-08 MED ORDER — SODIUM CHLORIDE 0.9 % IV BOLUS (SEPSIS)
1000.0000 mL | Freq: Once | INTRAVENOUS | Status: AC
  Administered 2017-06-08: 1000 mL via INTRAVENOUS

## 2017-06-08 MED ORDER — NALOXONE HCL 4 MG/0.1ML NA LIQD
1.0000 | Freq: Once | NASAL | Status: AC
  Administered 2017-06-08: 1 via NASAL
  Filled 2017-06-08: qty 4

## 2017-06-08 NOTE — ED Provider Notes (Signed)
[facility] Emergency Department Provider Note    First MD Initiated Contact with Patient 06/08/17 1527     (approximate)  I have reviewed the triage vital signs and the nursing notes.   HISTORY  Chief Complaint "I have to pee"  Level V Caveat:  Toxic encephalopathy  HPI Eric Manning is a 46 y.o. male brought in by EMS after being found unresponsive in his car on the side of his driveway responding to 2 mg of Narcan and presents for evaluation. Patient with extensive history of alcohol abuse and polysubstance abuse. Patient uncooperative exam. Denies any pain. No nausea or vomiting. States "I have to [place]."  patient able to ambulate to the restroom to urinate. Does endorse alcohol use.   Past Medical History:  Diagnosis Date  . Alcoholism (HCC)   . Aneurysm (HCC)   . Depression   . Seizures (HCC)    last seizure 2006 alcohol withdrawals   Family History  Problem Relation Age of Onset  . Heart attack Father   . Hypertension Father   . Diabetes Neg Hx    Past Surgical History:  Procedure Laterality Date  . rotator cuff surgery    . SKIN GRAFT     Patient Active Problem List   Diagnosis Date Noted  . Alcohol withdrawal (HCC) 01/01/2017  . Alcohol dependence (HCC) 07/13/2012  . Depressive disorder 07/13/2012  . Post traumatic stress disorder 07/13/2012      Prior to Admission medications   Medication Sig Start Date End Date Taking? Authorizing Provider  busPIRone (BUSPAR) 10 MG tablet Take 1 tablet (10 mg total) by mouth 2 (two) times daily. 01/08/17   Gouru, Deanna Artis, MD  FLUoxetine (PROZAC) 10 MG capsule Take 1 capsule (10 mg total) by mouth daily. 01/09/17   Ramonita Lab, MD  folic acid (FOLVITE) 1 MG tablet Take 1 tablet (1 mg total) by mouth daily. 01/09/17   Ramonita Lab, MD  ibuprofen (ADVIL,MOTRIN) 600 MG tablet Take 1 tablet (600 mg total) by mouth every 8 (eight) hours as needed. Patient not taking: Reported on 01/06/2017 12/07/16   Joni Reining, PA-C  LORazepam (ATIVAN) 1 MG tablet Take 4 the 1st day Take 3 the 2nd day Take 2 the 3rd day Take 1 the 4th day 01/03/17   Milagros Loll, MD  Multiple Vitamin (MULTIVITAMIN WITH MINERALS) TABS tablet Take 1 tablet by mouth daily. 01/09/17   Ramonita Lab, MD  thiamine 100 MG tablet Take 1 tablet (100 mg total) by mouth daily. 01/09/17   Ramonita Lab, MD    Allergies Patient has no known allergies.    Social History Social History  Substance Use Topics  . Smoking status: Never Smoker  . Smokeless tobacco: Never Used  . Alcohol use 0.6 oz/week    12 - 18 Cans of beer, 1 Shots of liquor per week     Comment: daily beer 18 cans or more; liquor occasionally, "as much as I can get"    Review of Systems Patient denies headaches, rhinorrhea, blurry vision, numbness, shortness of breath, chest pain, edema, cough, abdominal pain, nausea, vomiting, diarrhea, dysuria, fevers, rashes or hallucinations unless otherwise stated above in HPI. ____________________________________________   PHYSICAL EXAM:  VITAL SIGNS: Vitals:   06/08/17 1534  BP: 132/76  Pulse: (!) 118  Resp: 18  Temp: 97.7 F (36.5 C)  SpO2: 95%    Constitutional: intoxicated, smells of alcohol, slurred speech Eyes: Conjunctivae are normal. Pupils 3mm and reactive Head:  Atraumatic. Nose: No congestion/rhinnorhea. Mouth/Throat: Mucous membranes are moist.   Neck: No stridor. Painless ROM.  Cardiovascular: Normal rate, regular rhythm. Grossly normal heart sounds.  Good peripheral circulation. Respiratory: Normal respiratory effort.  No retractions. Lungs CTAB. Gastrointestinal: Soft and nontender. No distention. No abdominal bruits. No CVA tenderness. Genitourinary:  Musculoskeletal: No lower extremity tenderness nor edema.  No joint effusions. Neurologic: intoxicated, slurred speech, uncooperative with exam Skin:  Skin is warm, dry and intact. No rash noted. Psychiatric: unable to  assess  ____________________________________________   LABS (all labs ordered are listed, but only abnormal results are displayed)  Results for orders placed or performed during the hospital encounter of 06/08/17 (from the past 24 hour(s))  CBC with Differential/Platelet     Status: None   Collection Time: 06/08/17  3:59 PM  Result Value Ref Range   WBC 5.6 3.8 - 10.6 K/uL   RBC 4.75 4.40 - 5.90 MIL/uL   Hemoglobin 15.1 13.0 - 18.0 g/dL   HCT 16.1 09.6 - 04.5 %   MCV 93.8 80.0 - 100.0 fL   MCH 31.9 26.0 - 34.0 pg   MCHC 34.0 32.0 - 36.0 g/dL   RDW 40.9 81.1 - 91.4 %   Platelets 257 150 - 440 K/uL   Neutrophils Relative % 57 %   Neutro Abs 3.2 1.4 - 6.5 K/uL   Lymphocytes Relative 34 %   Lymphs Abs 1.9 1.0 - 3.6 K/uL   Monocytes Relative 6 %   Monocytes Absolute 0.3 0.2 - 1.0 K/uL   Eosinophils Relative 2 %   Eosinophils Absolute 0.1 0 - 0.7 K/uL   Basophils Relative 1 %   Basophils Absolute 0.1 0 - 0.1 K/uL  Basic metabolic panel     Status: Abnormal   Collection Time: 06/08/17  3:59 PM  Result Value Ref Range   Sodium 141 135 - 145 mmol/L   Potassium 3.7 3.5 - 5.1 mmol/L   Chloride 105 101 - 111 mmol/L   CO2 24 22 - 32 mmol/L   Glucose, Bld 102 (H) 65 - 99 mg/dL   BUN 8 6 - 20 mg/dL   Creatinine, Ser 7.82 0.61 - 1.24 mg/dL   Calcium 9.0 8.9 - 95.6 mg/dL   GFR calc non Af Amer >60 >60 mL/min   GFR calc Af Amer >60 >60 mL/min   Anion gap 12 5 - 15   ____________________________________________  EKG My review and personal interpretation at Time: 16:59   Indication: ams  Rate: 105  Rhythm: sinus Axis: normal Other: normal intervals, no stemi, ____________________________________________  RADIOLOGY  I personally reviewed all radiographic images ordered to evaluate for the above acute complaints and reviewed radiology reports and findings.  These findings were personally discussed with the patient.  Please see medical record for radiology  report.  ____________________________________________   PROCEDURES  Procedure(s) performed:  Procedures    Critical Care performed: no ____________________________________________   INITIAL IMPRESSION / ASSESSMENT AND PLAN / ED COURSE  Pertinent labs & imaging results that were available during my care of the patient were reviewed by me and considered in my medical decision making (see chart for details).  DDX: etoh abuse, polysubstance abuse, electrolyte abn, dehydration,  KAIYDEN SIMKIN is a 46 y.o. who presents to the ED with  encephalopathy remains unresponsive as described above. Presentation most consistent with intoxication. CT head imaging will be ordered as the patient is chronically high risk unable to cooperate with neuro exam due to concern for bleed or  traumatic injury. Bladder except for the above differential. Patient without any significant response to Narcan. He does not have any productive dyspnea to suggest narcotic overdose.  Clinical Course as of Jun 09 1919  Sheral Flow Jun 08, 2017  1909 patient ambulating about room in no distress. Patient has called safe ride home. We'll continue to observe.  [PR]    Clinical Course User Index [PR] Willy Eddy, MD    patient name related with steady gait. States that he has a ride home for discharge. States that he is ready to leave and no longer wants to wait in the ER. Patient without any evidence of SI or HI. Admits to drinking excessively. No evidence of withdrawal. No evidence of head injury. Blood work is reassuring. When I went back to my desk after having conversation with the patient to prepare his discharge before the patient had already ambulated out of the ER and eloped.  He was instructed not to drive while under the influence of alcohol. ______________________________________   FINAL CLINICAL IMPRESSION(S) / ED DIAGNOSES  Final diagnoses:  Alcoholic intoxication without complication (HCC)      NEW  MEDICATIONS STARTED DURING THIS VISIT:  New Prescriptions   No medications on file     Note:  This document was prepared using Dragon voice recognition software and may include unintentional dictation errors.    Willy Eddy, MD 06/08/17 [phone]

## 2017-06-08 NOTE — ED Triage Notes (Signed)
Pt presents to ED via EMS due to be found in a car "unresponsive". Pt appears lethargic, disoriented, with possible ETOH

## 2017-06-08 NOTE — ED Notes (Signed)
Pt confirms ride for discharge. States I am ready to leave, I am just gonna walk out. Notified MD, pt just walked out

## 2017-08-02 ENCOUNTER — Emergency Department
Admission: EM | Admit: 2017-08-02 | Discharge: 2017-08-02 | Disposition: A | Discharge status: Home / Self Care | Payer: Self-pay | Attending: Emergency Medicine | Admitting: Emergency Medicine

## 2017-08-02 DIAGNOSIS — Y939 Activity, unspecified: Secondary | ICD-10-CM | POA: Insufficient documentation

## 2017-08-02 DIAGNOSIS — Y929 Unspecified place or not applicable: Secondary | ICD-10-CM | POA: Insufficient documentation

## 2017-08-02 DIAGNOSIS — X58XXXA Exposure to other specified factors, initial encounter: Secondary | ICD-10-CM | POA: Insufficient documentation

## 2017-08-02 DIAGNOSIS — S025XXA Fracture of tooth (traumatic), initial encounter for closed fracture: Secondary | ICD-10-CM | POA: Insufficient documentation

## 2017-08-02 DIAGNOSIS — K029 Dental caries, unspecified: Secondary | ICD-10-CM | POA: Insufficient documentation

## 2017-08-02 DIAGNOSIS — Y999 Unspecified external cause status: Secondary | ICD-10-CM | POA: Insufficient documentation

## 2017-08-02 DIAGNOSIS — S025XXB Fracture of tooth (traumatic), initial encounter for open fracture: Secondary | ICD-10-CM

## 2017-08-02 DIAGNOSIS — Z79899 Other long term (current) drug therapy: Secondary | ICD-10-CM | POA: Insufficient documentation

## 2017-08-02 MED ORDER — LIDOCAINE VISCOUS 2 % MT SOLN: 10.0000 mL | OROMUCOSAL | 0 refills | Status: AC | PRN

## 2017-08-02 MED ORDER — AMOXICILLIN 500 MG PO CAPS: 500.0000 mg | ORAL_CAPSULE | Freq: Three times a day (TID) | ORAL | 0 refills | Status: AC

## 2017-08-02 MED ORDER — KETOROLAC TROMETHAMINE 10 MG PO TABS: 10.0000 mg | ORAL_TABLET | Freq: Four times a day (QID) | ORAL | 0 refills | Status: AC | PRN

## 2017-08-02 MED ORDER — OXYCODONE-ACETAMINOPHEN 5-325 MG PO TABS
1.0000 | ORAL_TABLET | Freq: Once | ORAL | Status: AC
  Administered 2017-08-02: 1 via ORAL
  Filled 2017-08-02: qty 1

## 2017-08-02 MED ORDER — LIDOCAINE VISCOUS 2 % MT SOLN
15.0000 mL | Freq: Once | OROMUCOSAL | Status: AC
  Administered 2017-08-02: 15 mL via OROMUCOSAL
  Filled 2017-08-02: qty 15

## 2017-08-02 MED ORDER — KETOROLAC TROMETHAMINE 30 MG/ML IJ SOLN
30.0000 mg | Freq: Once | INTRAMUSCULAR | Status: AC
  Administered 2017-08-02: 30 mg via INTRAMUSCULAR
  Filled 2017-08-02: qty 1

## 2017-08-02 NOTE — ED Notes (Signed)
See triage note. Pt c/o dental pain to R upper teeth. Several teeth in this area missing, one noted to be broken. Pt states "it usually doesn't bother me until it does."

## 2017-08-02 NOTE — Discharge Instructions (Signed)
OPTIONS FOR DENTAL FOLLOW UP CARE ° °[place] [facility] and Human Services - Local Safety Net Dental Clinics °http://www.ncdhhs.gov/dph/oralhealth/services/safetynetclinics.htm °  °[facility] (336-562-3123) ° °[facility] [place] ([phone]) ° °[facility] ([phone] ext [phone]) ° °[facility] ([phone]) ° °[facility] ([phone]) °This clinic caters to the indigent population and is on a lottery system. °Location: °[facility], [address], [place] °Clinic Hours: °Wednesdays from 6pm - 9pm, patients seen by a lottery system. °For dates, call or go to www.med.[facility].edu/[facility]/patients/Dental-[facility] °Services: °Cleanings, fillings and simple extractions. °Payment Options: °DENTAL WORK IS FREE OF CHARGE. Bring proof of income or support. °Best way to get seen: °Arrive at 5:15 pm - this is a lottery, NOT first come/first serve, so arriving earlier will not increase your chances of being seen. °  °  °[facility] °[phone] °Select option 1 for emergencies °  °Location: °[facility], [address], [place] °Clinic Hours: °No walk-ins accepted - call the day before to schedule an appointment. °Check in times are 9:30 am and 1:30 pm. °Services: °Simple extractions, temporary fillings, pulpectomy/pulp debridement, uncomplicated abscess drainage. °Payment Options: °PAYMENT IS DUE AT THE TIME OF SERVICE.  Fee is usually $100-200, additional surgical procedures (e.g. abscess drainage) may be extra. °Cash, checks, Visa/MasterCard accepted.  Can file Medicaid if patient is covered for dental - patient should call case worker to check. °No discount for [facility] patients. °Best way to get seen: °MUST call the day before and get onto the schedule. Can usually be seen the next 1-2 days. No walk-ins accepted. °  °  °[facility] °[phone] °   °Location: °[facility], [address], [place] °Clinic Hours: °M, W, Th, F 8am or 1:30pm, Tues 9a or 1:30 - first come/first served. °Services: °Simple extractions, temporary fillings, uncomplicated abscess drainage.  You do not need to be an [place] resident. °Payment Options: °PAYMENT IS DUE AT THE TIME OF SERVICE. °Dental insurance, otherwise sliding scale - bring proof of income or support. °Depending on income and treatment needed, cost is usually $50-200. °Best way to get seen: °Arrive early as it is first come/first served. °  °  °[facility] °[phone] °  °Location: °[address] °Clinic Hours: °Mon-Thu 8a-5p °Services: °Most basic dental services including extractions and fillings. °Payment Options: °PAYMENT IS DUE AT THE TIME OF SERVICE. °Sliding scale, up to 50% off - bring proof if income or support. °Medicaid with dental option accepted. °Best way to get seen: °Call to schedule an appointment, can usually be seen within 2 weeks OR they will try to see walk-ins - show up at 8a or 2p (you may have to wait). °  °  °[facility] °[phone] °[place] RESIDENTS ONLY °  °Location: °[facility], [address], [place], [place] [address] °Clinic Hours: By appointment only. °Monday - Thursday 8am-5pm, Friday 8am-12pm °Services: Cleanings, fillings, extractions. °Payment Options: °PAYMENT IS DUE AT THE TIME OF SERVICE. °Cash, Visa or MasterCard. Sliding scale - $30 minimum per service. °Best way to get seen: °Come in to office, complete packet and make an appointment - need proof of income °or support monies for each household member and proof of [place] residence. °Usually takes about a month to get in. °  °  °[facility] °[phone] °  °Location: °[address]   [place] °Clinic Hours: Walk-in Urgent Care Dental Services are offered Monday-Friday  mornings only. °The numbers of emergencies accepted daily is limited to the number of °providers available. °Maximum 15 - Mondays, Wednesdays & Thursdays °Maximum 10 - Tuesdays & Fridays °Services: °You do not need to be a [place] resident to be seen for a dental emergency. °Emergencies are defined as pain, swelling, abnormal bleeding, or dental trauma. Walkins will receive x-rays if needed. °NOTE: Dental cleaning is not an emergency. °Payment Options: °PAYMENT IS DUE AT THE TIME OF SERVICE. °Minimum co-pay is $40.00 for uninsured patients. °Minimum co-pay is $3.00 for Medicaid with dental coverage. °Dental Insurance is accepted and must be presented at time of visit. °Medicare does not cover dental. °Forms of payment: Cash, credit card, checks. °Best way to get seen: °If not previously registered with the clinic, walk-in dental registration begins at 7:15 am and is on a first come/first serve basis. °If previously registered with the clinic, call to make an appointment. °  °  °[facility] °[phone] °[place] RESIDENTS ONLY °  °Location: °[address], [place], [place] °Clinic Hours: °Mon-Thu 10a-2p °Services: Extractions only! °Payment Options: °FREE (donations accepted) - bring proof of income or support °Best way to get seen: °Call and schedule an appointment OR come at 8am on the 1st Monday of every month (except for holidays) when it is first come/first served. °  °  °[facility] °[phone] °  °Location: °[address], [place] °Clinic Hours: °Friday mornings °Services, Payment Options, Best way to get seen: °Call for info °

## 2017-08-02 NOTE — ED Triage Notes (Signed)
Patient c/o right upper jaw pain. Patient has broken tooth for > 1 year but "does not have the money to get it fixed"  Patient c/o pain currently

## 2017-08-02 NOTE — ED Provider Notes (Signed)
[facility] Emergency Department Provider Note  ____________________________________________  Time seen: Approximately 3:42 PM  I have reviewed the triage vital signs and the nursing notes.   HISTORY  Chief Complaint Dental Pain    HPI Eric Manning is a 46 y.o. male that presents to the emergency department for evaluation of right upper tooth pain for 1 year.  Patient states that pain has been worsening over the last 6 months.  He is unsure of drainage from site of tooth but states that he had a funny taste in his mouth earlier.  He states that he does not have the money to fix tooth.  No fever, chills, nausea, vomiting.  Past Medical History:  Diagnosis Date  . Alcoholism (HCC)   . Aneurysm (HCC)   . Depression   . Seizures (HCC)    last seizure 2006 alcohol withdrawals    Patient Active Problem List   Diagnosis Date Noted  . Alcohol withdrawal (HCC) 01/01/2017  . Alcohol dependence (HCC) 07/13/2012  . Depressive disorder 07/13/2012  . Post traumatic stress disorder 07/13/2012    Past Surgical History:  Procedure Laterality Date  . rotator cuff surgery    . SKIN GRAFT      Prior to Admission medications   Medication Sig Start Date End Date Taking? Authorizing Provider  amoxicillin (AMOXIL) 500 MG capsule Take 1 capsule (500 mg total) by mouth 3 (three) times daily. 08/02/17   Enid Derry, PA-C  busPIRone (BUSPAR) 10 MG tablet Take 1 tablet (10 mg total) by mouth 2 (two) times daily. Patient not taking: Reported on 06/08/2017 01/08/17   Ramonita Lab, MD  FLUoxetine (PROZAC) 10 MG capsule Take 1 capsule (10 mg total) by mouth daily. Patient not taking: Reported on 06/08/2017 01/09/17   Ramonita Lab, MD  folic acid (FOLVITE) 1 MG tablet Take 1 tablet (1 mg total) by mouth daily. Patient not taking: Reported on 06/08/2017 01/09/17   Ramonita Lab, MD  ibuprofen (ADVIL,MOTRIN) 600 MG tablet Take 1 tablet (600 mg total) by mouth every 8 (eight) hours as  needed. Patient not taking: Reported on 01/06/2017 12/07/16   Joni Reining, PA-C  ketorolac (TORADOL) 10 MG tablet Take 1 tablet (10 mg total) by mouth every 6 (six) hours as needed. 08/02/17   Enid Derry, PA-C  lidocaine (XYLOCAINE) 2 % solution Use as directed 10 mLs in the mouth or throat as needed for mouth pain. 08/02/17   Enid Derry, PA-C  LORazepam (ATIVAN) 1 MG tablet Take 4 the 1st day Take 3 the 2nd day Take 2 the 3rd day Take 1 the 4th day Patient not taking: Reported on 06/08/2017 01/03/17   Milagros Loll, MD  Multiple Vitamin (MULTIVITAMIN WITH MINERALS) TABS tablet Take 1 tablet by mouth daily. 01/09/17   Ramonita Lab, MD  thiamine 100 MG tablet Take 1 tablet (100 mg total) by mouth daily. 01/09/17   Ramonita Lab, MD    Allergies Patient has no known allergies.  Family History  Problem Relation Age of Onset  . Heart attack Father   . Hypertension Father   . Diabetes Neg Hx     Social History Social History   Tobacco Use  . Smoking status: Never Smoker  . Smokeless tobacco: Never Used  Substance Use Topics  . Alcohol use: Yes    Alcohol/week: 0.6 oz    Types: 12 - 18 Cans of beer, 1 Shots of liquor per week    Comment: daily beer 18 cans or  more; liquor occasionally, "as much as I can get"  . Drug use: Yes    Types: Cocaine    Comment: 1 week ago. Snorted Cocaine.      Review of Systems  Constitutional: No fever/chills Cardiovascular: No chest pain. Respiratory: No SOB. Gastrointestinal:  No nausea, no vomiting.  Musculoskeletal: Negative for musculoskeletal pain. Skin: Negative for rash, abrasions, lacerations, ecchymosis. Neurological: Negative for headaches, numbness or tingling   ____________________________________________   PHYSICAL EXAM:  VITAL SIGNS: ED Triage Vitals  Enc Vitals Group     BP 08/02/17 1312 132/85     Pulse Rate 08/02/17 1312 78     Resp 08/02/17 1312 18     Temp 08/02/17 1312 98 F (36.7 C)     Temp Source 08/02/17  1312 Oral     SpO2 08/02/17 1312 97 %     Weight 08/02/17 1304 170 lb (77.1 kg)     Height 08/02/17 1304 5\' 9"  (1.753 m)     Head Circumference --      Peak Flow --      Pain Score 08/02/17 1304 7     Pain Loc --      Pain Edu? --      Excl. in GC? --      Constitutional: Alert and oriented. Well appearing and in no acute distress. Eyes: Conjunctivae are normal. PERRL. EOMI. Head: Atraumatic. ENT:      Ears:      Nose: No congestion/rhinnorhea.      Mouth/Throat: Mucous membranes are moist.  Poor dentition.  Several cavities and broken teeth.  Tenderness to palpation around upper right canine.  No drainage.  No TMJ pain.  No swelling.  Neck: No stridor. Cardiovascular: Normal rate, regular rhythm.  Good peripheral circulation. Respiratory: Normal respiratory effort without tachypnea or retractions. Lungs CTAB. Good air entry to the bases with no decreased or absent breath sounds. Musculoskeletal: Full range of motion to all extremities. No gross deformities appreciated. Neurologic:  Normal speech and language. No gross focal neurologic deficits are appreciated.  Skin:  Skin is warm, dry and intact. No rash noted.   ____________________________________________   LABS (all labs ordered are listed, but only abnormal results are displayed)  Labs Reviewed - No data to display ____________________________________________  EKG   ____________________________________________  RADIOLOGY  No results found.  ____________________________________________    PROCEDURES  Procedure(s) performed:    Procedures    Medications  ketorolac (TORADOL) 30 MG/ML injection 30 mg (30 mg Intramuscular Given 08/02/17 1502)  lidocaine (XYLOCAINE) 2 % viscous mouth solution 15 mL (15 mLs Mouth/Throat Given 08/02/17 1502)  oxyCODONE-acetaminophen (PERCOCET/ROXICET) 5-325 MG per tablet 1 tablet (1 tablet Oral Given 08/02/17 1538)      ____________________________________________   INITIAL IMPRESSION / ASSESSMENT AND PLAN / ED COURSE  Pertinent labs & imaging results that were available during my care of the patient were reviewed by me and considered in my medical decision making (see chart for details).  Review of the [place] CSRS was performed in accordance of the [facility] prior to dispensing any controlled drugs.   Patient presented to the emergency department for evaluation of worsening dental pain for 1 year.  Vital signs and exam are reassuring.  Dental resources were provided. Patient will be discharged home with prescriptions for amoxicillin, viscous lidocaine, tordal. Patient is to follow up with dentist as directed. Patient is given ED precautions to return to the ED for any worsening or new symptoms.  ____________________________________________  FINAL CLINICAL IMPRESSION(S) / ED DIAGNOSES  Final diagnoses:  Open fracture of tooth, initial encounter  Pain due to dental caries        This chart was dictated using voice recognition software/Dragon. Despite best efforts to proofread, errors can occur which can change the meaning. Any change was purely unintentional. Presents to the emergency department for evaluation of right upper   Enid DerryWagner, Tierany Appleby, PA-C 08/02/17 1552    Governor RooksLord, Rebecca, MD 08/07/17 0830

## 2019-09-02 IMAGING — CT CT HEAD W/O CM
3 series · 14 of 47 positions shown, 16 images · non-contrast
Comparison: June 24, 2005

CLINICAL DATA: Altered level of consciousness/ disorientation and
lethargy

EXAM:
CT HEAD WITHOUT CONTRAST
TECHNIQUE: Contiguous axial images were obtained from the base of the skull
through the vertex without intravenous contrast.

[Series 2: head wo · axial · 0.47mm/px · z∈[-75,+50]mm · 8 of 31 slices shown, 10 images]
[im 3/31  brain]
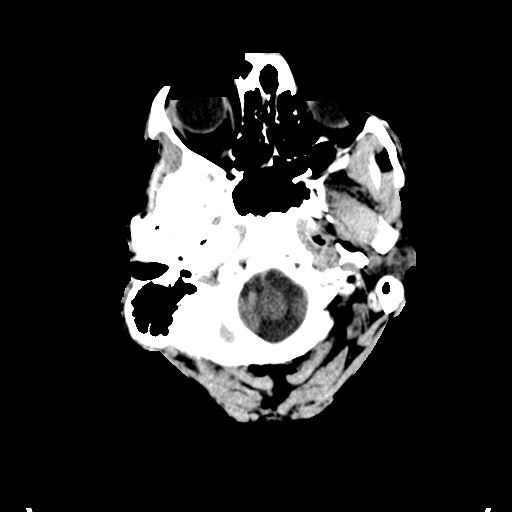
[im 3/31  bone]
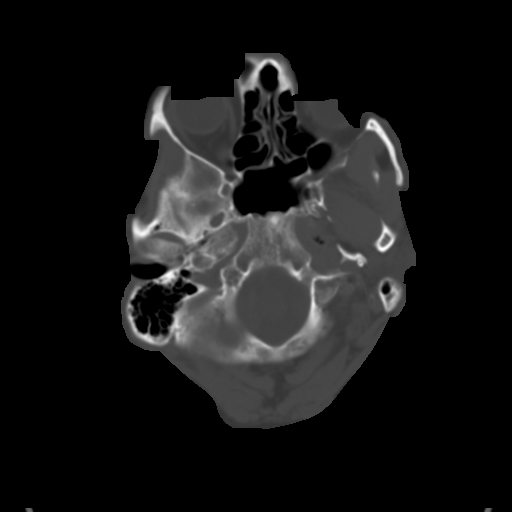
[im 7/31  brain]
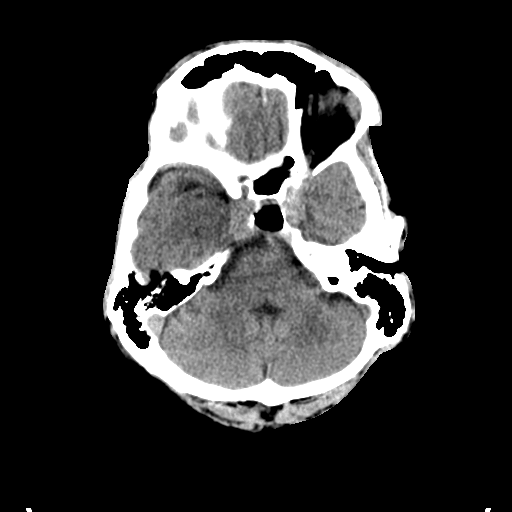
[im 10/31  brain]
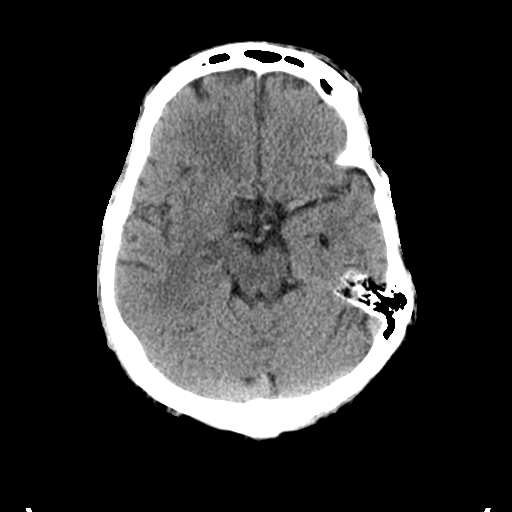
[im 14/31  brain]
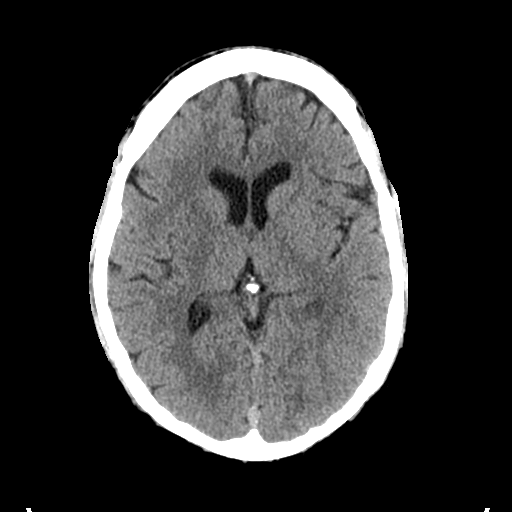
[im 17/31  brain]
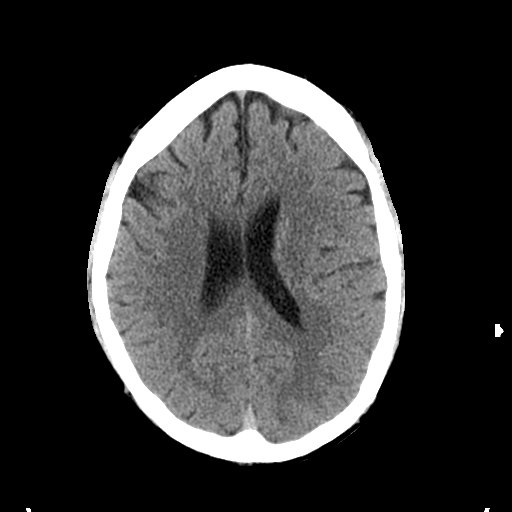
[im 17/31  bone]
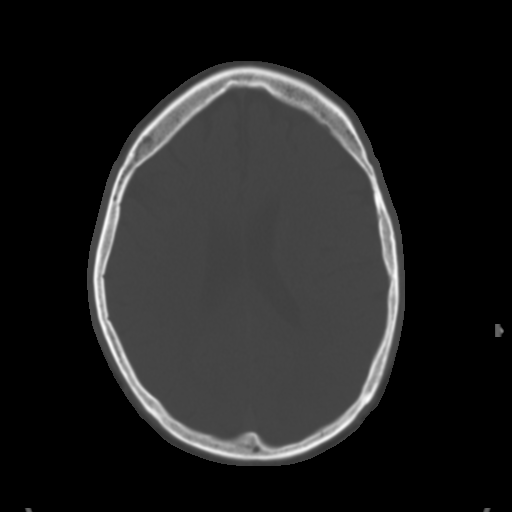
[im 21/31  brain]
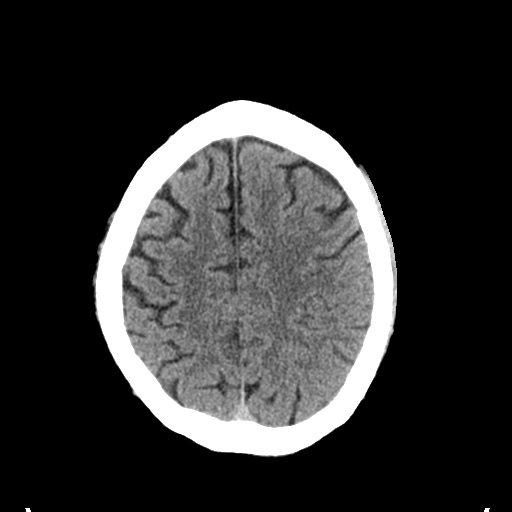
[im 24/31  brain]
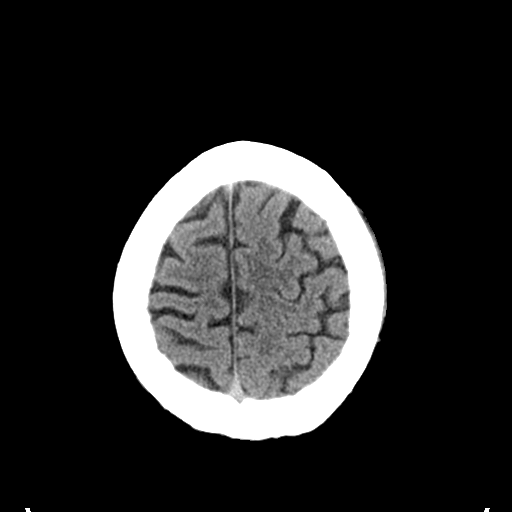
[im 28/31  brain]
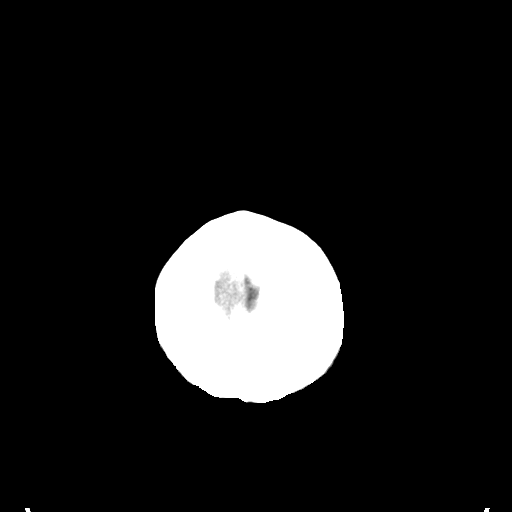

[Series 4: coronal soft tissue · coronal · 0.30mm/px · 3 of 64 slices shown]
[im 22/64  brain]
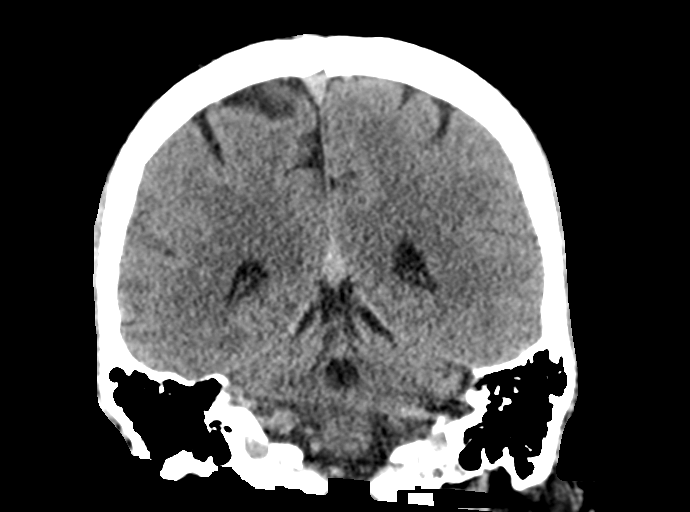
[im 29/64  brain]
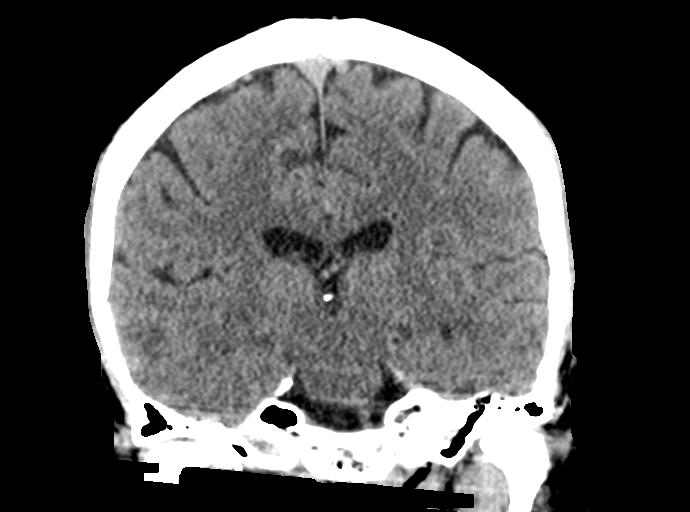
[im 36/64  brain]
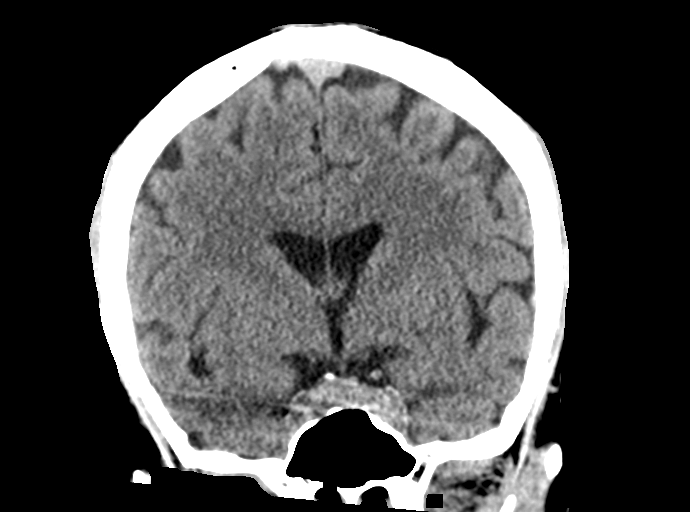

[Series 5: sagittal soft tissue · sagittal · 0.29mm/px · 3 of 51 slices shown]
[im 19/51  brain]
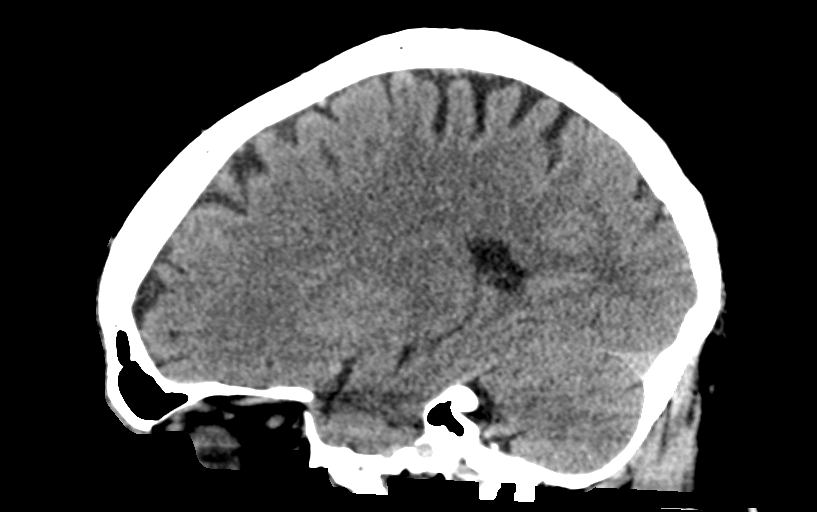
[im 26/51  brain]
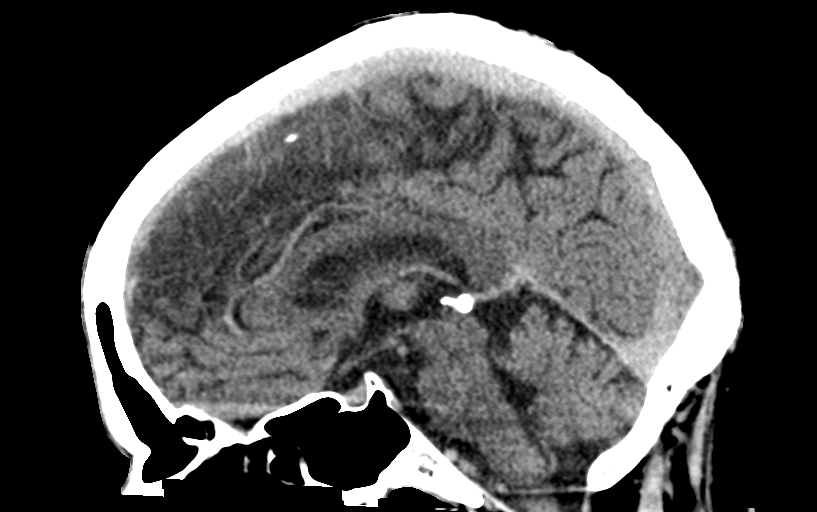
[im 32/51  brain]
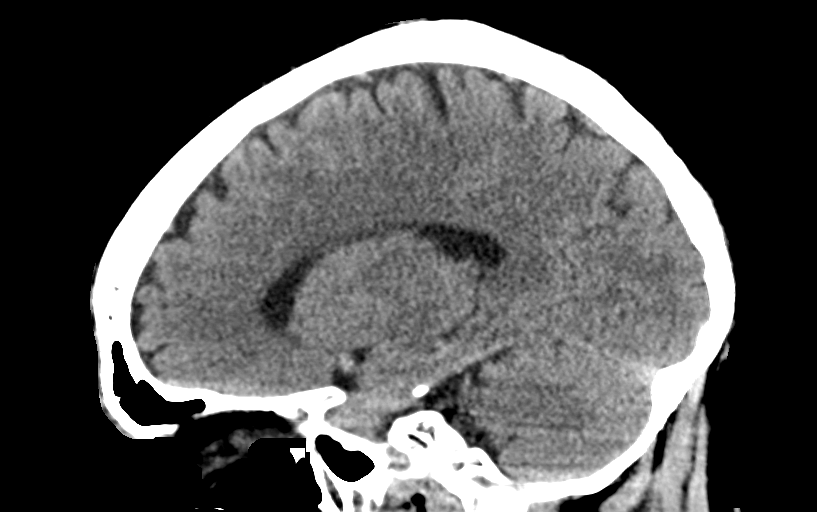

[14 of 47 positions shown; findings below may reference images not displayed]

FINDINGS: Brain: The ventricles are normal in size and configuration. There is
no intracranial mass, hemorrhage, extra-axial fluid collection, or
midline shift. Gray-white compartments appear normal. No evident
acute infarct.

Vascular: There is no appreciable hyperdense vessel. There is no
appreciable vascular calcification.

Skull: Bony calvarium appears intact.

Sinuses/Orbits: There is opacification in the inferior right frontal
sinus. There is mucosal thickening in several ethmoid air cells.
There is slight mucosal thickening in the anterior sphenoid sinus
regions. Other paranasal sinuses which are visualized are clear.
Visualized orbits appear symmetric bilaterally.

Other: The visualized mastoid air cells are clear.
IMPRESSION: Areas of paranasal sinus disease. No intracranial mass, hemorrhage,
or extra-axial fluid collection. Gray-white compartments appear
normal.
